# Patient Record
Sex: Female | Born: 1960
Health system: Southern US, Community
[De-identification: ages and names within clinical notes are randomized; demographics above are authoritative.]

## PROBLEM LIST (undated history)

## (undated) DIAGNOSIS — M1990 Unspecified osteoarthritis, unspecified site: Secondary | ICD-10-CM

## (undated) DIAGNOSIS — J45909 Unspecified asthma, uncomplicated: Secondary | ICD-10-CM

## (undated) DIAGNOSIS — I10 Essential (primary) hypertension: Secondary | ICD-10-CM

## (undated) HISTORY — DX: Unspecified osteoarthritis, unspecified site: M19.90

---

## 1997-12-07 ENCOUNTER — Emergency Department (HOSPITAL_COMMUNITY): Admission: EM | Admit: 1997-12-07 | Discharge: 1997-12-07 | Payer: Self-pay | Admitting: *Deleted

## 1998-03-25 ENCOUNTER — Emergency Department (HOSPITAL_COMMUNITY): Admission: EM | Admit: 1998-03-25 | Discharge: 1998-03-25 | Payer: Self-pay | Admitting: Emergency Medicine

## 1998-07-15 ENCOUNTER — Other Ambulatory Visit: Admission: RE | Admit: 1998-07-15 | Discharge: 1998-07-15 | Payer: Self-pay | Admitting: Obstetrics

## 1999-11-15 ENCOUNTER — Other Ambulatory Visit: Admission: RE | Admit: 1999-11-15 | Discharge: 1999-11-15 | Payer: Self-pay | Admitting: Obstetrics

## 2000-07-24 ENCOUNTER — Other Ambulatory Visit: Admission: RE | Admit: 2000-07-24 | Discharge: 2000-07-24 | Payer: Self-pay | Admitting: Obstetrics

## 2002-05-31 ENCOUNTER — Encounter: Admission: RE | Admit: 2002-05-31 | Discharge: 2002-05-31 | Payer: Self-pay | Admitting: Internal Medicine

## 2003-05-14 ENCOUNTER — Emergency Department (HOSPITAL_COMMUNITY): Admission: AD | Admit: 2003-05-14 | Discharge: 2003-05-14 | Payer: Self-pay | Admitting: Family Medicine

## 2004-06-27 HISTORY — PX: ABDOMINAL HYSTERECTOMY: SHX81

## 2005-12-14 ENCOUNTER — Inpatient Hospital Stay (HOSPITAL_COMMUNITY): Admission: RE | Admit: 2005-12-14 | Discharge: 2005-12-16 | Payer: Self-pay | Admitting: Obstetrics

## 2005-12-14 ENCOUNTER — Encounter (INDEPENDENT_AMBULATORY_CARE_PROVIDER_SITE_OTHER): Payer: Self-pay | Admitting: *Deleted

## 2007-07-10 ENCOUNTER — Emergency Department (HOSPITAL_COMMUNITY): Admission: EM | Admit: 2007-07-10 | Discharge: 2007-07-10 | Payer: Self-pay | Admitting: Emergency Medicine

## 2008-12-18 ENCOUNTER — Emergency Department (HOSPITAL_COMMUNITY): Admission: EM | Admit: 2008-12-18 | Discharge: 2008-12-18 | Payer: Self-pay | Admitting: Emergency Medicine

## 2010-02-17 ENCOUNTER — Emergency Department (HOSPITAL_COMMUNITY): Admission: EM | Admit: 2010-02-17 | Discharge: 2010-02-18 | Payer: Self-pay | Admitting: Emergency Medicine

## 2010-07-05 ENCOUNTER — Emergency Department (HOSPITAL_COMMUNITY)
Admission: EM | Admit: 2010-07-05 | Discharge: 2010-07-05 | Payer: Self-pay | Source: Home / Self Care | Admitting: Emergency Medicine

## 2010-11-12 NOTE — Discharge Summary (Signed)
Beth Knapp, Beth Knapp                ACCOUNT NO.:  [id number]   MEDICAL RECORD NO.:  [id number]          PATIENT TYPE:  INP   LOCATION:  9312                          FACILITY:  [facility]   PHYSICIAN:  Kathreen Cosier, M.D.DATE OF BIRTH:  1960/09/29   DATE OF ADMISSION:  12/14/2005  DATE OF DISCHARGE:  12/16/2005                                 DISCHARGE SUMMARY   HOSPITAL COURSE:  The patient is a 50 year old gravida 5, para 4-0-1-4, with  a history of large myomas, who was admitted for definitive therapy because  of lower abdominal pain.   On admission, her hemoglobin was 11.9, white count 8.3, sodium 140,  potassium 4.1, chloride 107, CO2 29, BUN 7, total bilirubin 0.7.   On December 14, 2005, she underwent a TAH and bilateral salpingectomy and did  well.  Postoperatively, her hemoglobin was 10.2.  She had an uneventful  postoperative course and was discharged on the second postoperative day,  ambulatory, on a regular diet, on Tylox for pain, to see me in 4 weeks.   DISCHARGE DIAGNOSIS:  Status post total abdominal hysterectomy/bilateral  __________ for myoma uteri.           ______________________________  Kathreen Cosier, M.D.     BAM/MEDQ  D:  12/16/2005  T:  12/16/2005  Job:  [id number]

## 2010-11-12 NOTE — Op Note (Signed)
Beth Knapp, Beth Knapp                ACCOUNT NO.:  [id number]   MEDICAL RECORD NO.:  [id number]          PATIENT TYPE:  INP   LOCATION:  9399                          FACILITY:  [facility]   PHYSICIAN:  Kathreen Cosier, M.D.DATE OF BIRTH:  [birth year]   DATE OF PROCEDURE:  12/14/2005  DATE OF DISCHARGE:                                 OPERATIVE REPORT   PREOPERATIVE DIAGNOSIS:  Myoma uteri, 16- to 18-weeks' size.   POSTOPERATIVE DIAGNOSIS:  Myoma uteri, 16- to 18-weeks' size.   PROCEDURE:  1.  Total abdominal hysterectomy.  2.  Bilateral salpingectomy.   SURGEON:  Kathreen Cosier, M.D.   FIRST ASSISTANT:  Charles A. Clearance Coots, M.D.   ANESTHESIA:  General.   PROCEDURE:  The patient was placed on the operating table in a supine  position, general anesthesia administered by Dr. Jean Rosenthal, abdomen prepped  and draped, bladder emptied with a Foley catheter.  A transverse suprapubic  incision was made and carried down to the rectus fascia, fascia cleaned and  incised the length of the incision.  The recti muscles were retracted  laterally, peritoneum incised longitudinally.  The uterus was involved  multiple myomas, 16- to 18-weeks' size.  Ovaries were normal.  The right  round ligament was grasped with a Kelly clamp, cut and suture-ligated with  #1 chromic; the procedure was done in a similar fashion on the other side.  Using the Metzenbaum scissors, the bladder flap was developed and the  bladder was dissected off the uterus and cervix.  The right utero-ovarian  ligament was grasped with a Kelly clamp, cut and suture-ligated with a #1  chromic, procedure done in a similar fashion on the other side.  Uterine  vessels were skeletonized bilaterally, double-clamped with Heaney clamps on  the right, cut and suture-ligated x2 with #1 chromic; the procedure was done  in a similar fashion on the other side.  The uterus was separated from the  cervix at this point with the scalpel.  The cervix  was grasped with straight  Kocher clamps and the cardinal and uterosacral ligaments on the right were  clamped, cut and suture-ligated with #1, procedure done in a similar fashion  on the other side.  The cervix was removed at the cervicovaginal junction  with Mayo scissors, then the vaginal vault was run.  Modified Richardson  sutures were placed in the angles of the vagina, then the vaginal vault run  with an interlocking suture of #1 chromic.  Hemostasis was satisfactory.  The opposite side was repertionealized with 2-0 chromic.  The left tube was  grasped with a Kelly clamp, cut and suture-ligated with #1 chromic,  procedure done in a similar fashion on the right tube.  Lap and sponge  counts were  correct.  The abdomen was closed in layers, the peritoneum with a continuous  suture of 0 chromic, the fascia with a continuous suture of 0 Dexon and skin  closed with a subcuticular stitch of 4-0 Monocryl.  Blood loss -- 300 mL.  The patient tolerated her procedure well and taken to recovery room in good  condition.           ______________________________  Kathreen Cosier, M.D.     BAM/MEDQ  D:  12/14/2005  T:  12/14/2005  Job:  [id number]

## 2010-12-15 ENCOUNTER — Emergency Department (HOSPITAL_COMMUNITY)
Admission: EM | Admit: 2010-12-15 | Discharge: 2010-12-15 | Disposition: A | Discharge status: Home / Self Care | Payer: 59 | Attending: Emergency Medicine | Admitting: Emergency Medicine

## 2010-12-15 DIAGNOSIS — M545 Low back pain, unspecified: Secondary | ICD-10-CM | POA: Insufficient documentation

## 2010-12-15 DIAGNOSIS — Y9269 Other specified industrial and construction area as the place of occurrence of the external cause: Secondary | ICD-10-CM | POA: Insufficient documentation

## 2010-12-15 DIAGNOSIS — Y990 Civilian activity done for income or pay: Secondary | ICD-10-CM | POA: Insufficient documentation

## 2010-12-15 DIAGNOSIS — S335XXA Sprain of ligaments of lumbar spine, initial encounter: Secondary | ICD-10-CM | POA: Insufficient documentation

## 2010-12-15 DIAGNOSIS — X500XXA Overexertion from strenuous movement or load, initial encounter: Secondary | ICD-10-CM | POA: Insufficient documentation

## 2010-12-15 LAB — URINALYSIS, ROUTINE W REFLEX MICROSCOPIC
Glucose, UA: NEGATIVE mg/dL
Hgb urine dipstick: NEGATIVE
Protein, ur: NEGATIVE mg/dL

## 2013-04-15 ENCOUNTER — Emergency Department (HOSPITAL_BASED_OUTPATIENT_CLINIC_OR_DEPARTMENT_OTHER): Payer: 59

## 2013-04-15 ENCOUNTER — Emergency Department (HOSPITAL_BASED_OUTPATIENT_CLINIC_OR_DEPARTMENT_OTHER)
Admission: EM | Admit: 2013-04-15 | Discharge: 2013-04-15 | Disposition: A | Discharge status: Home / Self Care | Payer: 59 | Attending: Emergency Medicine | Admitting: Emergency Medicine

## 2013-04-15 ENCOUNTER — Encounter (HOSPITAL_BASED_OUTPATIENT_CLINIC_OR_DEPARTMENT_OTHER): Payer: Self-pay | Admitting: Emergency Medicine

## 2013-04-15 DIAGNOSIS — S8990XA Unspecified injury of unspecified lower leg, initial encounter: Secondary | ICD-10-CM | POA: Insufficient documentation

## 2013-04-15 DIAGNOSIS — M79605 Pain in left leg: Secondary | ICD-10-CM

## 2013-04-15 DIAGNOSIS — Y9301 Activity, walking, marching and hiking: Secondary | ICD-10-CM | POA: Insufficient documentation

## 2013-04-15 DIAGNOSIS — X500XXA Overexertion from strenuous movement or load, initial encounter: Secondary | ICD-10-CM | POA: Insufficient documentation

## 2013-04-15 DIAGNOSIS — Y929 Unspecified place or not applicable: Secondary | ICD-10-CM | POA: Insufficient documentation

## 2013-04-15 MED ORDER — OXYCODONE-ACETAMINOPHEN 5-325 MG PO TABS: 1.0000 | ORAL_TABLET | Freq: Four times a day (QID) | ORAL | Status: DC | PRN

## 2013-04-15 MED ORDER — OXYCODONE-ACETAMINOPHEN 5-325 MG PO TABS
1.0000 | ORAL_TABLET | Freq: Once | ORAL | Status: AC
  Administered 2013-04-15: 1 via ORAL
  Filled 2013-04-15: qty 1

## 2013-04-15 NOTE — ED Provider Notes (Signed)
CSN: [id number]     Arrival date & time 04/15/13  1525 History  This chart was scribed for Junius Argyle, MD by Blanchard Kelch, ED Scribe. The patient was seen in room MH01/MH01. Patient's care was started at 3:42 PM.    Chief Complaint  Patient presents with  . Leg Pain    Patient is a 52 y.o. female presenting with leg pain. The history is provided by the patient. No language interpreter was used.  Leg Pain Location:  Knee Time since incident:  1 week Injury: no   Knee location:  L knee Pain details:    Quality:  Sharp   Radiates to:  L leg   Duration:  1 week   Timing:  Constant Chronicity:  New Prior injury to area:  No Relieved by:  Nothing Exacerbated by: walking. Associated symptoms: no fever     HPI Comments: Beth Knapp is a 52 y.o. female who presents to the Emergency Department complaining of constant left back of knee pain that began about a week ago. The pain radiates down to her left foot. She describes the pain as sharp and stabbing. The pain is worsened by walking, but she is ambulatory. She has been taking OTC medication for the pain without relief. She states that two days ago she heard a "pop" in the back of her leg while she was walking down the stairs. She denies any prior similar pain, strain or injury to the area. She denies fever, vomiting or diarrhea. She denies any past pertinent medical history. She does take any prescription medication daily. She has a past surgical history of a hysterectomy. She denies a history of cancer. She denies any recent immobilization. She denies a history of blood clots in her legs. She denies smoking or drug use. She consumes alcohol occasionally.    History reviewed. No pertinent past medical history. Past Surgical History  Procedure Laterality Date  . Abdominal hysterectomy     No family history on file. History  Substance Use Topics  . Smoking status: Never Smoker   . Smokeless tobacco: Not on file  . Alcohol  Use: Yes   OB History   Grav Para Term Preterm Abortions TAB SAB Ect Mult Living                 Review of Systems  Constitutional: Negative for fever and chills.  HENT: Negative for congestion.   Eyes: Negative for pain.  Respiratory: Negative for cough.   Cardiovascular: Negative for chest pain.  Gastrointestinal: Negative for vomiting and diarrhea.  Genitourinary: Negative for dysuria.  Musculoskeletal: Negative for gait problem.  Skin: Negative for rash.  Neurological: Negative for speech difficulty.  Hematological: Negative for adenopathy.  Psychiatric/Behavioral: Negative for confusion. The patient is not nervous/anxious.     Allergies  Review of patient's allergies indicates no known allergies.  Home Medications  No current outpatient prescriptions on file.  Triage Vitals: BP 165/72  Pulse 91  Temp(Src) 98.3 F (36.8 C) (Oral)  Resp 18  Ht 5\' 4"  (1.626 m)  Wt 199 lb (90.266 kg)  BMI 34.14 kg/m2  SpO2 97%  Physical Exam  Nursing note and vitals reviewed. Constitutional: She is oriented to person, place, and time. She appears well-developed and well-nourished. No distress.  HENT:  Head: Normocephalic and atraumatic.  Mouth/Throat: Oropharynx is clear and moist and mucous membranes are normal.  Eyes: Conjunctivae and EOM are normal. Pupils are equal, round, and reactive to light.  Neck:  Normal range of motion. Neck supple. No tracheal deviation present.  Cardiovascular: Normal rate, regular rhythm and normal heart sounds.   Pulses:      Popliteal pulses are 2+ on the right side, and 2+ on the left side.       Dorsalis pedis pulses are 2+ on the right side, and 2+ on the left side.       Posterior tibial pulses are 2+ on the right side, and 2+ on the left side.  2+ DP/PT pulses bilaterally. 2+ Popliteal pulses bilaterally.   Pulmonary/Chest: Effort normal and breath sounds normal. No respiratory distress.  Abdominal: Soft. She exhibits no distension. There is  no tenderness.  Musculoskeletal: Normal range of motion.  Mild tenderness to palpation of lower posterior left thigh and popliteal area. Mild darkening of the skin and mild swelling in left popliteal area. No gross asymmetry of lower extremities upon inspection.  Neurological: She is alert and oriented to person, place, and time. No sensory deficit.  Skin: Skin is warm and dry.  Psychiatric: She has a normal mood and affect. Her behavior is normal.    ED Course  Procedures (including critical care time)  DIAGNOSTIC STUDIES: Oxygen Saturation is 97% on room air, normal by my interpretation.    COORDINATION OF CARE: 3:50 PM - Will order left leg ultrasound and Percocet for the pain. Patient verbalizes understanding and agrees with treatment plan.     Labs Review Labs Reviewed - No data to display Imaging Review US Venous Img Lower Unilateral Left  04/15/2013   CLINICAL DATA:  Posterior knee pain radiates to the foot.  EXAM: VENOUS DOPPLER ULTRASOUND OF LEFT LOWER EXTREMITY  TECHNIQUE: Gray-scale sonography with graded compression, as well as color Doppler and duplex ultrasound, were performed to evaluate the deep venous system from the level of the common femoral vein through the popliteal and proximal calf veins. Spectral Doppler was utilized to evaluate flow at rest and with distal augmentation maneuvers.  COMPARISON:  None.  FINDINGS: Thrombus within deep veins:  Next visit crest that none visualized.  Compressibility of deep veins:  Normal.  Duplex waveform respiratory phasicity:  Normal.  Duplex waveform response to augmentation:  Normal.  Other findings: Posterior tibial and peroneal veins are patent or visualized below the knee.  IMPRESSION: No evidence for DVT in the left lower extremity.   Electronically Signed   By: Kennith Center M.D.   On: 04/15/2013 16:37    EKG Interpretation   None       MDM   1. Left leg pain    4:33 PM 52 y.o. female who presents with left popliteal  and left lower thigh pain for approximately one week. She denies any injuries. She is afebrile and vital signs are unremarkable here. She has mild swelling of the left posterior popliteal area and darkening of this area which could be consistent with increased perfusion of collateral superficial veins. She also has tenderness to palpation of the left popliteal area and left posterior thigh. Will give Percocet for pain and get an ultrasound to rule out DVT.  5:17 PM: I interpreted/reviewed the imaging which was non-contributory. I suspect a msk cause of her pain. Will rec rest/ice/elevation. Will provide a small amount of percocet for pain control.  I have discussed the diagnosis/risks/treatment options with the patient and family and believe the pt to be eligible for discharge home to follow-up with pcp or establish with a pcp as needed. We also discussed returning to  the ED immediately if new or worsening sx occur. We discussed the sx which are most concerning (e.g., worsening or continued pain, fever) that necessitate immediate return. Any new prescriptions provided to the patient are listed below.  New Prescriptions   OXYCODONE-ACETAMINOPHEN (PERCOCET) 5-325 MG PER TABLET    Take 1 tablet by mouth every 6 (six) hours as needed for pain.      I personally performed the services described in this documentation, which was scribed in my presence. The recorded information has been reviewed and is accurate.    Junius Argyle, MD 04/15/13 1723

## 2013-04-15 NOTE — ED Notes (Signed)
Pt c/o left calf pain from her knee down to her foot x1 week. Pt denies injury to leg and denies long car/plane trips.

## 2013-07-27 ENCOUNTER — Emergency Department (HOSPITAL_COMMUNITY)
Admission: EM | Admit: 2013-07-27 | Discharge: 2013-07-27 | Disposition: A | Discharge status: Home / Self Care | Payer: Managed Care, Other (non HMO) | Attending: Emergency Medicine | Admitting: Emergency Medicine

## 2013-07-27 ENCOUNTER — Encounter (HOSPITAL_COMMUNITY): Payer: Self-pay | Admitting: Emergency Medicine

## 2013-07-27 ENCOUNTER — Emergency Department (HOSPITAL_COMMUNITY): Payer: Managed Care, Other (non HMO)

## 2013-07-27 DIAGNOSIS — M25569 Pain in unspecified knee: Secondary | ICD-10-CM | POA: Insufficient documentation

## 2013-07-27 DIAGNOSIS — M1712 Unilateral primary osteoarthritis, left knee: Secondary | ICD-10-CM

## 2013-07-27 DIAGNOSIS — J45909 Unspecified asthma, uncomplicated: Secondary | ICD-10-CM | POA: Insufficient documentation

## 2013-07-27 DIAGNOSIS — M25469 Effusion, unspecified knee: Secondary | ICD-10-CM | POA: Insufficient documentation

## 2013-07-27 HISTORY — DX: Unspecified asthma, uncomplicated: J45.909

## 2013-07-27 MED ORDER — DICLOFENAC SODIUM ER 100 MG PO TB24: 100.0000 mg | ORAL_TABLET | Freq: Every day | ORAL | Status: DC

## 2013-07-27 MED ORDER — HYDROCODONE-ACETAMINOPHEN 5-325 MG PO TABS: 1.0000 | ORAL_TABLET | ORAL | Status: DC | PRN

## 2013-07-27 MED ORDER — HYDROCODONE-ACETAMINOPHEN 5-325 MG PO TABS
1.0000 | ORAL_TABLET | Freq: Once | ORAL | Status: DC
  Filled 2013-07-27: qty 1

## 2013-07-27 NOTE — ED Notes (Signed)
Pt c/o left leg pain with intermittent swelling x 1 month. Pt denies injury. Pt ambulatory in triage.

## 2013-07-27 NOTE — ED Provider Notes (Signed)
CSN: [id number]     Arrival date & time 07/27/13  1809 History   None    This chart was scribed for non-physician practitioner, Cherrie Distance PA-C, working with Lyanne Co, MD by Arlan Organ, ED Scribe. This patient was seen in room TR07C/TR07C and the patient's care was started at 6:27 PM.   Chief Complaint  Patient presents with  . Leg Pain    pain and swelling   The history is provided by the patient. No language interpreter was used.    HPI Comments: Beth Knapp is a 53 y.o. female who presents to the Emergency Department complaining of ongoing, intermittent, moderate left sided leg pain that radiates down to her left foot that initially started 1 month ago. She reports mild associated swelling to the area. She states she notes the pain originating in her left knee. Denies any recent injury or trauma. Pt states ambulating worsens her discomfort, and she denies any alleviating factors at this time. She reports having similar pain and being treated here in the ED about 2 months ago to the same area. At time of visit pt had a venous lower doppler performed with normal findings to her lower leg to rule out DVTs. Pt has a PMHx of asthma, and reports no other concerns at this time.  Past Medical History  Diagnosis Date  . Asthma    Past Surgical History  Procedure Laterality Date  . Abdominal hysterectomy     No family history on file. History  Substance Use Topics  . Smoking status: Never Smoker   . Smokeless tobacco: Not on file  . Alcohol Use: Yes   OB History   Grav Para Term Preterm Abortions TAB SAB Ect Mult Living                 Review of Systems  Cardiovascular: Positive for leg swelling.  Musculoskeletal: Positive for myalgias (Left sided leg pain).  All other systems reviewed and are negative.    Allergies  Review of patient's allergies indicates no known allergies.  Home Medications   Current Outpatient Rx  Name  Route  Sig  Dispense  Refill  .  oxyCODONE-acetaminophen (PERCOCET) 5-325 MG per tablet   Oral   Take 1 tablet by mouth every 6 (six) hours as needed for pain.   15 tablet   0    Triage Vitals: BP 157/82  Pulse 90  Temp(Src) 97.6 F (36.4 C) (Oral)  Resp 16  Ht 5\' 4"  (1.626 m)  Wt 196 lb (88.905 kg)  BMI 33.63 kg/m2  SpO2 97%  Physical Exam  Nursing note and vitals reviewed. Constitutional: She is oriented to person, place, and time. She appears well-developed and well-nourished.  HENT:  Head: Normocephalic and atraumatic.  Eyes: EOM are normal.  Neck: Normal range of motion.  Pulmonary/Chest: Effort normal.  Musculoskeletal: Normal range of motion. She exhibits edema and tenderness.       Right knee: She exhibits normal range of motion, no swelling, normal alignment, no LCL laxity, no bony tenderness and no MCL laxity. No tenderness found. No medial joint line and no lateral joint line tenderness noted.       Left knee: She exhibits swelling and bony tenderness. She exhibits normal range of motion, no effusion, no erythema, normal alignment, no LCL laxity and no MCL laxity. Tenderness found. Lateral joint line tenderness noted. No medial joint line tenderness noted.  Neurological: She is alert and oriented to person, place, and  time.  Skin: Skin is warm and dry.  Psychiatric: She has a normal mood and affect. Her behavior is normal.    ED Course  Procedures (including critical care time)  DIAGNOSTIC STUDIES: Oxygen Saturation is 97% on RA, Normal by my interpretation.    COORDINATION OF CARE: 6:29 PM- Will order X-Ray. Will give pain medication. Discussed treatment plan with pt at bedside and pt agreed to plan.     Labs Review Labs Reviewed - No data to display Imaging Review No results found.  EKG Interpretation   None      Results for orders placed during the hospital encounter of 12/15/10  URINALYSIS, ROUTINE W REFLEX MICROSCOPIC      Result Value Range   Color, Urine YELLOW  YELLOW    APPearance CLEAR  CLEAR   Specific Gravity, Urine 1.018  1.005 - 1.030   pH 5.5  5.0 - 8.0   Glucose, UA NEGATIVE  NEGATIVE mg/dL   Hgb urine dipstick NEGATIVE  NEGATIVE   Bilirubin Urine NEGATIVE  NEGATIVE   Ketones, ur NEGATIVE  NEGATIVE mg/dL   Protein, ur NEGATIVE  NEGATIVE mg/dL   Urobilinogen, UA 0.2  0.0 - 1.0 mg/dL   Nitrite NEGATIVE  NEGATIVE   Leukocytes, UA NEGATIVE  NEGATIVE   Dg Knee Complete 4 Views Left  07/27/2013   CLINICAL DATA:  Leg pain  EXAM: LEFT KNEE - COMPLETE 4+ VIEW  COMPARISON:  None.  FINDINGS: No fracture or dislocation. Minimal tricompartmental arthritis. Cannot exclude possibility of a tiny joint effusion.  IMPRESSION: Mild degenerative changes.   Electronically Signed   By: Esperanza Heiraymond  Rubner M.D.   On: 07/27/2013 19:21     MDM  Left knee osteoarthritis  Patient here with left knee pain - has been evaluated recently for same with negative US at that time.  Now with complaints of pain to anterior portion of the knee.  No evidence of lexity in the ligaments, no instability of the joint and no effusion noted on exam.  No erythema  To suggest septic arthritis.  I personally performed the services described in this documentation, which was scribed in my presence. The recorded information has been reviewed and is accurate.    Izola PriceFrances C. Marisue HumbleSanford, New JerseyPA-C 07/27/13 1939

## 2013-07-27 NOTE — ED Provider Notes (Signed)
Medical screening examination/treatment/procedure(s) were performed by non-physician practitioner and as supervising physician I was immediately available for consultation/collaboration.  EKG Interpretation   None         Kemberly Taves M Beya Tipps, MD 07/27/13 2305 

## 2013-07-27 NOTE — ED Notes (Signed)
Patient transported to X-ray 

## 2013-07-27 NOTE — Discharge Instructions (Signed)
Arthritis, Nonspecific °Arthritis is inflammation of a joint. This usually means pain, redness, warmth or swelling are present. One or more joints may be involved. There are a number of types of arthritis. Your caregiver may not be able to tell what type of arthritis you have right away. °CAUSES  °The most common cause of arthritis is the wear and tear on the joint (osteoarthritis). This causes damage to the cartilage, which can break down over time. The knees, hips, back and neck are most often affected by this type of arthritis. °Other types of arthritis and common causes of joint pain include: °· Sprains and other injuries near the joint. Sometimes minor sprains and injuries cause pain and swelling that develop hours later. °· Rheumatoid arthritis. This affects hands, feet and knees. It usually affects both sides of your body at the same time. It is often associated with chronic ailments, fever, weight loss and general weakness. °· Crystal arthritis. Gout and pseudo gout can cause occasional acute severe pain, redness and swelling in the foot, ankle, or knee. °· Infectious arthritis. Bacteria can get into a joint through a break in overlying skin. This can cause infection of the joint. Bacteria and viruses can also spread through the blood and affect your joints. °· Drug, infectious and allergy reactions. Sometimes joints can become mildly painful and slightly swollen with these types of illnesses. °SYMPTOMS  °· Pain is the main symptom. °· Your joint or joints can also be red, swollen and warm or hot to the touch. °· You may have a fever with certain types of arthritis, or even feel overall ill. °· The joint with arthritis will hurt with movement. Stiffness is present with some types of arthritis. °DIAGNOSIS  °Your caregiver will suspect arthritis based on your description of your symptoms and on your exam. Testing may be needed to find the type of arthritis: °· Blood and sometimes urine tests. °· X-ray tests  and sometimes CT or MRI scans. °· Removal of fluid from the joint (arthrocentesis) is done to check for bacteria, crystals or other causes. Your caregiver (or a specialist) will numb the area over the joint with a local anesthetic, and use a needle to remove joint fluid for examination. This procedure is only minimally uncomfortable. °· Even with these tests, your caregiver may not be able to tell what kind of arthritis you have. Consultation with a specialist (rheumatologist) may be helpful. °TREATMENT  °Your caregiver will discuss with you treatment specific to your type of arthritis. If the specific type cannot be determined, then the following general recommendations may apply. °Treatment of severe joint pain includes: °· Rest. °· Elevation. °· Anti-inflammatory medication (for example, ibuprofen) may be prescribed. Avoiding activities that cause increased pain. °· Only take over-the-counter or prescription medicines for pain and discomfort as recommended by your caregiver. °· Cold packs over an inflamed joint may be used for 10 to 15 minutes every hour. Hot packs sometimes feel better, but do not use overnight. Do not use hot packs if you are diabetic without your caregiver's permission. °· A cortisone shot into arthritic joints may help reduce pain and swelling. °· Any acute arthritis that gets worse over the next 1 to 2 days needs to be looked at to be sure there is no joint infection. °Long-term arthritis treatment involves modifying activities and lifestyle to reduce joint stress jarring. This can include weight loss. Also, exercise is needed to nourish the joint cartilage and remove waste. This helps keep the muscles   around the joint strong. °HOME CARE INSTRUCTIONS  °· Do not take aspirin to relieve pain if gout is suspected. This elevates uric acid levels. °· Only take over-the-counter or prescription medicines for pain, discomfort or fever as directed by your caregiver. °· Rest the joint as much as  possible. °· If your joint is swollen, keep it elevated. °· Use crutches if the painful joint is in your leg. °· Drinking plenty of fluids may help for certain types of arthritis. °· Follow your caregiver's dietary instructions. °· Try low-impact exercise such as: °· Swimming. °· Water aerobics. °· Biking. °· Walking. °· Morning stiffness is often relieved by a warm shower. °· Put your joints through regular range-of-motion. °SEEK MEDICAL CARE IF:  °· You do not feel better in 24 hours or are getting worse. °· You have side effects to medications, or are not getting better with treatment. °SEEK IMMEDIATE MEDICAL CARE IF:  °· You have a fever. °· You develop severe joint pain, swelling or redness. °· Many joints are involved and become painful and swollen. °· There is severe back pain and/or leg weakness. °· You have loss of bowel or bladder control. °Document Released: 07/21/2004 Document Revised: 09/05/2011 Document Reviewed: 08/06/2008 °ExitCare® Patient Information ©2014 [employer]. ° °Wear and Tear Disorders of the Knee (Arthritis, Osteoarthritis) °Everyone will experience wear and tear injuries (arthritis, osteoarthritis) of the knee. These are the changes we all get as we age. They come from the joint stress of daily living. The amount of cartilage damage in your knee and your symptoms determine if you need surgery. Mild problems require approximately two months recovery time. More severe problems take several months to recover. With mild problems, your surgeon may find worn and rough cartilage surfaces. With severe changes, your surgeon may find cartilage that has completely worn away and exposed the bone. Loose bodies of bone and cartilage, bone spurs (excess bone growth), and injuries to the menisci (cushions between the large bones of your leg) are also common. All of these problems can cause pain. °For a mild wear and tear problem, rough cartilage may simply need to be shaved and smoothed. For more  severe problems with areas of exposed bone, your surgeon may use an instrument for roughing up the bone surfaces to stimulate new cartilage growth. Loose bodies are usually removed. Torn menisci may be trimmed or repaired. °ABOUT THE ARTHROSCOPIC PROCEDURE °Arthroscopy is a surgical technique. It allows your orthopedic surgeon to diagnose and treat your knee injury with accuracy. The surgeon looks into your knee through a small scope. The scope is like a small (pencil-sized) telescope. Arthroscopy is less invasive than open knee surgery. You can expect a more rapid recovery. After the procedure, you will be moved to a recovery area until most of the effects of the medication have worn off. Your caregiver will discuss the test results with you. °RECOVERY °The severity of the arthritis and the type of procedure performed will determine recovery time. Other important factors include age, physical condition, medical conditions, and the type of rehabilitation program. Strengthening your muscles after arthroscopy helps guarantee a better recovery. Follow your caregiver's instructions. Use crutches, rest, elevate, ice, and do knee exercises as instructed. Your caregivers will help you and instruct you with exercises and other physical therapy required to regain your mobility, muscle strength, and functioning following surgery. Only take over-the-counter or prescription medicines for pain, discomfort, or fever as directed by your caregiver.  °SEEK MEDICAL CARE IF:  °· There   is increased bleeding (more than a small spot) from the wound. °· You notice redness, swelling, or increasing pain in the wound. °· Pus is coming from wound. °· You develop an unexplained oral temperature above 102° F (38.9° C) , or as your caregiver suggests. °· You notice a foul smell coming from the wound or dressing. °· You have severe pain with motion of the knee. °SEEK IMMEDIATE MEDICAL CARE IF:  °· You develop a rash. °· You have difficulty  breathing. °· You have any allergic problems. °MAKE SURE YOU:  °· Understand these instructions. °· Will watch your condition. °· Will get help right away if you are not doing well or get worse. °Document Released: 06/10/2000 Document Revised: 09/05/2011 Document Reviewed: 11/07/2007 °ExitCare® Patient Information ©2014 [employer]. ° °

## 2014-04-22 ENCOUNTER — Encounter: Payer: Self-pay | Admitting: Gastroenterology

## 2014-04-25 ENCOUNTER — Ambulatory Visit (AMBULATORY_SURGERY_CENTER): Payer: Self-pay | Admitting: *Deleted

## 2014-04-25 VITALS — Ht 64.0 in | Wt 204.2 lb

## 2014-04-25 DIAGNOSIS — Z1211 Encounter for screening for malignant neoplasm of colon: Secondary | ICD-10-CM

## 2014-04-25 MED ORDER — MOVIPREP 100 G PO SOLR: ORAL | Status: DC

## 2014-04-25 NOTE — Progress Notes (Signed)
No allergies to eggs or soy. No problems with anesthesia.  Pt not given Emmi instructions for colonoscopy; no computer access  No oxygen use  No diet drug use  

## 2014-04-30 NOTE — Addendum Note (Signed)
Addended by: Gillermina HuMCCRAW, ELIZABETH W on: 04/30/2014 09:45 AM   Modules accepted: Level of Service

## 2014-05-01 ENCOUNTER — Encounter: Payer: Self-pay | Admitting: Gastroenterology

## 2014-05-06 ENCOUNTER — Ambulatory Visit (AMBULATORY_SURGERY_CENTER): Payer: Managed Care, Other (non HMO) | Admitting: Gastroenterology

## 2014-05-06 ENCOUNTER — Encounter: Payer: Self-pay | Admitting: Gastroenterology

## 2014-05-06 VITALS — BP 162/92 | HR 68 | Temp 97.8°F | Resp 16 | Ht 64.0 in | Wt 204.0 lb

## 2014-05-06 DIAGNOSIS — Z1211 Encounter for screening for malignant neoplasm of colon: Secondary | ICD-10-CM

## 2014-05-06 MED ORDER — SODIUM CHLORIDE 0.9 % IV SOLN: 500.0000 mL | INTRAVENOUS | Status: DC

## 2014-05-06 NOTE — Progress Notes (Signed)
Procedure ends, to recovery, report given and VSS. 

## 2014-05-06 NOTE — Patient Instructions (Signed)
YOU HAD AN ENDOSCOPIC PROCEDURE TODAY AT THE [facility]: Refer to the procedure report that was given to you for any specific questions about what was found during the examination.  If the procedure report does not answer your questions, please call your gastroenterologist to clarify.  If you requested that your care partner not be given the details of your procedure findings, then the procedure report has been included in a sealed envelope for you to review at your convenience later.  YOU SHOULD EXPECT: Some feelings of bloating in the abdomen. Passage of more gas than usual.  Walking can help get rid of the air that was put into your GI tract during the procedure and reduce the bloating. If you had a lower endoscopy (such as a colonoscopy or flexible sigmoidoscopy) you may notice spotting of blood in your stool or on the toilet paper. If you underwent a bowel prep for your procedure, then you may not have a normal bowel movement for a few days.  DIET: Your first meal following the procedure should be a light meal and then it is ok to progress to your normal diet.  A half-sandwich or bowl of soup is an example of a good first meal.  Heavy or fried foods are harder to digest and may make you feel nauseous or bloated.  Likewise meals heavy in dairy and vegetables can cause extra gas to form and this can also increase the bloating.  Drink plenty of fluids but you should avoid alcoholic beverages for 24 hours.  ACTIVITY: Your care partner should take you home directly after the procedure.  You should plan to take it easy, moving slowly for the rest of the day.  You can resume normal activity the day after the procedure however you should NOT DRIVE or use heavy machinery for 24 hours (because of the sedation medicines used during the test).    SYMPTOMS TO REPORT IMMEDIATELY: A gastroenterologist can be reached at any hour.  During normal business hours, 8:30 AM to 5:00 PM Monday through Friday,  call [phone].  After hours and on weekends, please call the GI answering service at [phone] who will take a message and have the physician on call contact you.   Following lower endoscopy (colonoscopy or flexible sigmoidoscopy):  Excessive amounts of blood in the stool  Significant tenderness or worsening of abdominal pains  Swelling of the abdomen that is new, acute  Fever of 100F or higher   FOLLOW UP: If any biopsies were taken you will be contacted by phone or by letter within the next 1-3 weeks.  Call your gastroenterologist if you have not heard about the biopsies in 3 weeks.  Our staff will call the home number listed on your records the next business day following your procedure to check on you and address any questions or concerns that you may have at that time regarding the information given to you following your procedure. This is a courtesy call and so if there is no answer at the home number and we have not heard from you through the emergency physician on call, we will assume that you have returned to your regular daily activities without incident.  SIGNATURES/CONFIDENTIALITY: You and/or your care partner have signed paperwork which will be entered into your electronic medical record.  These signatures attest to the fact that that the information above on your After Visit Summary has been reviewed and is understood.  Full responsibility of the confidentiality of   this discharge information lies with you and/or your care-partner.   Resume medications.Information given on diverticulosis,hemorrhoids and high fiber diet with discharge instructions. 

## 2014-05-06 NOTE — Op Note (Signed)
[facility] [address] [place], [id number]   COLONOSCOPY PROCEDURE REPORT  PATIENT: Beth EpleyWoods, Beth Knapp  MR#: [id number] BIRTHDATE: Aug 21, 1960 , 52  yrs. old GENDER: female ENDOSCOPIST: Meryl DareMalcolm T Shandrika Ambers, MD, [facility] REFERRED BY: Virl Sonammy Boyd, MD PROCEDURE DATE:  05/06/2014 PROCEDURE:   Colonoscopy, screening First Screening Colonoscopy - Avg.  risk and is 50 yrs.  old or older Yes.  Prior Negative Screening - Now for repeat screening. N/A  History of Adenoma - Now for follow-up colonoscopy & has been > or = to 3 yrs.  N/A  Polyps Removed Today? No.  Polyps Removed Today? No.  Recommend repeat exam, <10 yrs? Polyps Removed Today? No.  Recommend repeat exam, <10 yrs? No. ASA CLASS:   Class II INDICATIONS:average risk for colorectal cancer. MEDICATIONS: Monitored anesthesia care and Propofol 220 mg IV DESCRIPTION OF PROCEDURE:   After the risks benefits and alternatives of the procedure were thoroughly explained, informed consent was obtained.  The digital rectal exam revealed no abnormalities of the rectum.   The LB [id number]  endoscope was introduced through the anus and advanced to the cecum, which was identified by both the appendix and ileocecal valve. No adverse events experienced.   The quality of the prep was excellent, using MoviPrep  The instrument was then slowly withdrawn as the colon was fully examined.    COLON FINDINGS: There was mild diverticulosis noted in the ascending colon and at the cecum.   The examination was otherwise normal. Retroflexed views revealed internal Grade I hemorrhoids. The time to cecum=1 minutes 32 seconds.  Withdrawal time=8 minutes 53 seconds.  The scope was withdrawn and the procedure completed.  COMPLICATIONS: There were no immediate complications.  ENDOSCOPIC IMPRESSION: 1.   Mild diverticulosis in the ascending colon and at the cecum 2.   Grade l internal hemorrhoids  RECOMMENDATIONS: 1. High fiber diet with  liberal fluid intake. 2.  Colonoscopy in 10 years for routine risk screening. No need for routine, screening stool hemoccult testing for at least 5 years.  eSigned:  Meryl DareMalcolm T Soriya Worster, MD, [facility] 05/06/2014 11:35 AM

## 2014-05-07 ENCOUNTER — Telehealth: Payer: Self-pay

## 2014-05-07 NOTE — Telephone Encounter (Signed)
  Follow up Call-  Call back number 05/06/2014  Post procedure Call Back phone  # [phone]  Permission to leave phone message Yes     Patient questions:  Do you have a fever, pain , or abdominal swelling? No. Pain Score  0 *  Have you tolerated food without any problems? Yes.    Have you been able to return to your normal activities? Yes.    Do you have any questions about your discharge instructions: Diet   No. Medications  No. Follow up visit  No.  Do you have questions or concerns about your Care? No.  Actions: * If pain score is 4 or above: No action needed, pain <4.

## 2014-05-08 DIAGNOSIS — M79672 Pain in left foot: Secondary | ICD-10-CM

## 2014-05-08 DIAGNOSIS — M79671 Pain in right foot: Secondary | ICD-10-CM | POA: Insufficient documentation

## 2014-07-14 DIAGNOSIS — R609 Edema, unspecified: Secondary | ICD-10-CM | POA: Insufficient documentation

## 2015-02-12 ENCOUNTER — Emergency Department (HOSPITAL_COMMUNITY)
Admission: EM | Admit: 2015-02-12 | Discharge: 2015-02-12 | Disposition: A | Discharge status: Home / Self Care | Payer: BLUE CROSS/BLUE SHIELD | Attending: Emergency Medicine | Admitting: Emergency Medicine

## 2015-02-12 ENCOUNTER — Emergency Department (HOSPITAL_COMMUNITY): Payer: BLUE CROSS/BLUE SHIELD

## 2015-02-12 ENCOUNTER — Encounter (HOSPITAL_COMMUNITY): Payer: Self-pay | Admitting: Cardiology

## 2015-02-12 DIAGNOSIS — R079 Chest pain, unspecified: Secondary | ICD-10-CM | POA: Diagnosis not present

## 2015-02-12 DIAGNOSIS — Z8739 Personal history of other diseases of the musculoskeletal system and connective tissue: Secondary | ICD-10-CM | POA: Diagnosis not present

## 2015-02-12 DIAGNOSIS — Z79899 Other long term (current) drug therapy: Secondary | ICD-10-CM | POA: Diagnosis not present

## 2015-02-12 DIAGNOSIS — J45901 Unspecified asthma with (acute) exacerbation: Secondary | ICD-10-CM | POA: Diagnosis not present

## 2015-02-12 LAB — BASIC METABOLIC PANEL
Anion gap: 8 (ref 5–15)
BUN: 12 mg/dL (ref 6–20)
CO2: 29 mmol/L (ref 22–32)
Calcium: 9.3 mg/dL (ref 8.9–10.3)
Chloride: 106 mmol/L (ref 101–111)
Creatinine, Ser: 0.55 mg/dL (ref 0.44–1.00)
GFR calc Af Amer: >60 mL/min (ref >60)
GFR calc non Af Amer: >60 mL/min (ref >60)
Glucose, Bld: 94 mg/dL (ref 65–99)
Potassium: 4.2 mmol/L (ref 3.5–5.1)
Sodium: 143 mmol/L (ref 135–145)

## 2015-02-12 LAB — CBC
HCT: 37.9 % (ref 36.0–46.0)
Hemoglobin: 12.1 g/dL (ref 12.0–15.0)
MCH: 27.3 pg (ref 26.0–34.0)
MCHC: 31.9 g/dL (ref 30.0–36.0)
MCV: 85.4 fL (ref 78.0–100.0)
Platelets: 289 10*3/uL (ref 150–400)
RBC: 4.44 MIL/uL (ref 3.87–5.11)
RDW: 13.2 % (ref 11.5–15.5)
WBC: 10.3 10*3/uL (ref 4.0–10.5)

## 2015-02-12 LAB — I-STAT TROPONIN, ED: TROPONIN I, POC: 0 ng/mL (ref 0.00–0.08)

## 2015-02-12 MED ORDER — DIAZEPAM 5 MG PO TABS
5.0000 mg | ORAL_TABLET | Freq: Once | ORAL | Status: AC
  Administered 2015-02-12: 5 mg via ORAL
  Filled 2015-02-12: qty 1

## 2015-02-12 MED ORDER — KETOROLAC TROMETHAMINE 15 MG/ML IJ SOLN
15.0000 mg | Freq: Once | INTRAMUSCULAR | Status: AC
  Administered 2015-02-12: 15 mg via INTRAMUSCULAR
  Filled 2015-02-12: qty 1

## 2015-02-12 MED ORDER — METHOCARBAMOL 500 MG PO TABS: 500.0000 mg | ORAL_TABLET | Freq: Two times a day (BID) | ORAL | Status: DC | PRN

## 2015-02-12 NOTE — ED Notes (Signed)
Pt reports right sided chest pain for the past 3 days. Reports some SOB with the pain. Skin warm and dry.

## 2015-02-12 NOTE — Discharge Instructions (Signed)

## 2015-02-19 NOTE — ED Provider Notes (Signed)
CSN: [id number]     Arrival date & time 02/12/15  1236 History   First MD Initiated Contact with Patient 02/12/15 1357     Chief Complaint  Patient presents with  . Chest Pain     (Consider location/radiation/quality/duration/timing/severity/associated sxs/prior Treatment) HPI  54 year old female with chest pain. Right sided. Feels like a deep burning pain. Relatively constant. Onset was about 3 days ago. Denies any trauma. No appreciable exacerbating relieving factors. Feels mildly short of breath and the pain is worse. No cough. No fevers or chills. No unusual leg pain or swelling. Does not notice any significant change with exertion.  Past Medical History  Diagnosis Date  . Asthma   . Arthritis    Past Surgical History  Procedure Laterality Date  . Abdominal hysterectomy  2006   Family History  Problem Relation Age of Onset  . Colon cancer Neg Hx    Social History  Substance Use Topics  . Smoking status: Never Smoker   . Smokeless tobacco: Never Used  . Alcohol Use: 1.8 oz/week    3 Cans of beer per week   OB History    No data available     Review of Systems  All systems reviewed and negative, other than as noted in HPI.   Allergies  Review of patient's allergies indicates no known allergies.  Home Medications   Prior to Admission medications   Medication Sig Start Date End Date Taking? Authorizing Provider  furosemide (LASIX) 40 MG tablet Take 40 mg by mouth.   Yes Historical Provider, MD  losartan (COZAAR) 25 MG tablet Take 25 mg by mouth daily.   Yes Historical Provider, MD  Multiple Vitamins-Minerals (MULTIVITAMIN PO) Take 1 tablet by mouth daily.    Yes Historical Provider, MD  potassium chloride (K-DUR) 10 MEQ tablet Take 10 mEq by mouth daily.   Yes Historical Provider, MD  methocarbamol (ROBAXIN) 500 MG tablet Take 1 tablet (500 mg total) by mouth 2 (two) times daily as needed for muscle spasms. 02/12/15   Raeford Razor, MD   BP 130/82 mmHg  Pulse  71  Temp(Src) 98.6 F (37 C) (Oral)  Resp 16  Wt 204 lb (92.534 kg)  SpO2 99% Physical Exam  Constitutional: She appears well-developed and well-nourished. No distress.  HENT:  Head: Normocephalic and atraumatic.  Eyes: Conjunctivae are normal. Right eye exhibits no discharge. Left eye exhibits no discharge.  Neck: Neck supple.  Cardiovascular: Normal rate, regular rhythm and normal heart sounds.  Exam reveals no gallop and no friction rub.   No murmur heard. Pulmonary/Chest: Effort normal and breath sounds normal. No respiratory distress.  Abdominal: Soft. She exhibits no distension. There is no tenderness.  Musculoskeletal: She exhibits no edema or tenderness.  Lower extremities symmetric as compared to each other. No calf tenderness. Negative Homan's. No palpable cords.   Neurological: She is alert.  Skin: Skin is warm and dry.  Psychiatric: She has a normal mood and affect. Her behavior is normal. Thought content normal.  Nursing note and vitals reviewed.   ED Course  Procedures (including critical care time) Labs Review Labs Reviewed  BASIC METABOLIC PANEL  CBC  I-STAT TROPOININ, ED    Imaging Review No results found. I have personally reviewed and evaluated these images and lab results as part of my medical decision-making.   EKG Interpretation   Date/Time:  Thursday February 12 2015 13:14:37 EDT Ventricular Rate:  84 PR Interval:  126 QRS Duration: 80 QT Interval:  360  QTC Calculation: 425 R Axis:   -6 Text Interpretation:  Normal sinus rhythm Non-specific ST-t changes  Abnormal ECG No old tracing to compare Confirmed by Naraya Stoneberg  MD, Maymuna Detzel  [phone]) on 02/12/2015 2:01:33 PM      MDM   Final diagnoses:  Chest pain, unspecified chest pain type    54 year old female with chest pain. Right sided. Relatively constant. Atypical for ACS. EKG with nonspecific changes. No old for comparison. Doubt pulmonary embolism, dissection or other emergent process.It has  been determined that no acute conditions requiring further emergency intervention are present at this time. The patient has been advised of the diagnosis and plan. I reviewed any labs and imaging including any potential incidental findings. We have discussed signs and symptoms that warrant return to the ED and they are listed in the discharge instructions.      Raeford Razor, MD 02/19/15 1536

## 2015-08-12 ENCOUNTER — Emergency Department (HOSPITAL_COMMUNITY)
Admission: EM | Admit: 2015-08-12 | Discharge: 2015-08-12 | Disposition: A | Discharge status: Home / Self Care | Payer: Managed Care, Other (non HMO) | Attending: Emergency Medicine | Admitting: Emergency Medicine

## 2015-08-12 ENCOUNTER — Encounter (HOSPITAL_COMMUNITY): Payer: Self-pay | Admitting: Emergency Medicine

## 2015-08-12 ENCOUNTER — Emergency Department (HOSPITAL_COMMUNITY): Payer: Managed Care, Other (non HMO)

## 2015-08-12 DIAGNOSIS — M1612 Unilateral primary osteoarthritis, left hip: Secondary | ICD-10-CM

## 2015-08-12 DIAGNOSIS — R1030 Lower abdominal pain, unspecified: Secondary | ICD-10-CM | POA: Insufficient documentation

## 2015-08-12 DIAGNOSIS — Z79899 Other long term (current) drug therapy: Secondary | ICD-10-CM | POA: Insufficient documentation

## 2015-08-12 DIAGNOSIS — I10 Essential (primary) hypertension: Secondary | ICD-10-CM | POA: Insufficient documentation

## 2015-08-12 DIAGNOSIS — J45909 Unspecified asthma, uncomplicated: Secondary | ICD-10-CM | POA: Insufficient documentation

## 2015-08-12 HISTORY — DX: Essential (primary) hypertension: I10

## 2015-08-12 MED ORDER — METHOCARBAMOL 500 MG PO TABS: 500.0000 mg | ORAL_TABLET | Freq: Two times a day (BID) | ORAL | Status: AC | PRN

## 2015-08-12 MED ORDER — IBUPROFEN 800 MG PO TABS: 800.0000 mg | ORAL_TABLET | Freq: Three times a day (TID) | ORAL | Status: AC | PRN

## 2015-08-12 NOTE — Discharge Instructions (Signed)

## 2015-08-12 NOTE — ED Notes (Signed)
Patient states pain is only present when ambulating or bearing weight.

## 2015-08-12 NOTE — ED Notes (Signed)
Patient here with complaint of left hip pain. States onset yesterday. Denies injury. Ambulatory in and out of triage but with exacerbation of pain.

## 2015-08-12 NOTE — ED Provider Notes (Signed)
CSN: [id number]     Arrival date & time 08/12/15  1935 History  By signing my name below, I, Beth Knapp, attest that this documentation has been prepared under the direction and in the presence of Fayrene Helper, PA-C Electronically Signed: Soijett Knapp, ED Scribe. 08/12/2015. 9:16 PM.   Chief Complaint  Patient presents with  . Hip Pain      The history is provided by the patient. No language interpreter was used.    Beth Knapp is a 55 y.o. female with a medical hx of arthritis, HTN who presents to the Emergency Department complaining of constant, aching, left hip pain radiating to her left groin onset yesterday. She notes that she was ambulating when she first noticed the left hip pain. She notes her left hip pain is worsened with ambulating or bearing weight. Pt notes that her left hip pain is alleviated with sitting. Pt denies having this pain in the past. denies any alleviating factors. 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 She notes that she has tried advil yesterday for the relief of her symptoms. She denies color change, wound, rash, swelling, difficulty urinating, constipation, fever, chills, back pain, and any other symptoms.    Past Medical History  Diagnosis Date  . Asthma   . Arthritis   . Hypertension    Past Surgical History  Procedure Laterality Date  . Abdominal hysterectomy  2006   Family History  Problem Relation Age of Onset  . Colon cancer Neg Hx    Social History  Substance Use Topics  . Smoking status: Never Smoker   . Smokeless tobacco: Never Used  . Alcohol Use: 1.8 oz/week    3 Cans of beer per week   OB History    No data available     Review of Systems  Gastrointestinal: Negative for constipation.  Genitourinary: Negative for difficulty urinating.  Musculoskeletal: Positive for arthralgias. Negative for joint swelling and gait problem.  Skin: Negative for color change, rash and wound.      Allergies  Review of patient's  allergies indicates no known allergies.  Home Medications   Prior to Admission medications   Medication Sig Start Date End Date Taking? Authorizing Provider  furosemide (LASIX) 40 MG tablet Take 40 mg by mouth.    Historical Provider, MD  losartan (COZAAR) 25 MG tablet Take 25 mg by mouth daily.    Historical Provider, MD  methocarbamol (ROBAXIN) 500 MG tablet Take 1 tablet (500 mg total) by mouth 2 (two) times daily as needed for muscle spasms. 02/12/15   Raeford Razor, MD  Multiple Vitamins-Minerals (MULTIVITAMIN PO) Take 1 tablet by mouth daily.     Historical Provider, MD  potassium chloride (K-DUR) 10 MEQ tablet Take 10 mEq by mouth daily.    Historical Provider, MD   BP 155/99 mmHg  Pulse 101  Temp(Src) 99.2 F (37.3 C) (Oral)  Resp 16  Ht  (1.651 m)  Wt 202 lb (91.627 kg)  BMI 33.61 kg/m2  SpO2 95% Physical Exam  Constitutional: She is oriented to person, place, and time. She appears well-developed and well-nourished. No distress.  HENT:  Head: Normocephalic and atraumatic.  Eyes: EOM are normal.  Neck: Neck supple.  Cardiovascular: Normal rate.   Dorsalis pedis pulses intact.   Pulmonary/Chest: Effort normal. No respiratory distress.  Musculoskeletal: Normal range of motion.  Mild tenderness noted to left inguinal region with nl hip flexion, extension, abduction and adduction. No left knee or  ankle tenderness. No significant midline spinal tenderness, crepitus, or step-offs.   Neurological: She is alert and oriented to person, place, and time.  Pt able to ambulate without difficulty   Skin: Skin is warm and dry.  Psychiatric: She has a normal mood and affect. Her behavior is normal.  Nursing note and vitals reviewed.   ED Course  Procedures (including critical care time) DIAGNOSTIC STUDIES: Oxygen Saturation is 95% on RA, adequate by my interpretation.    COORDINATION OF CARE: 9:15 PM Discussed treatment plan with pt at bedside which includes gentle massages,  anti-inflammatory, referral and follow up with orthopedist PRN and pt agreed to plan.    Imaging Review Dg Hip Unilat With Pelvis 2-3 Views Left  08/12/2015  CLINICAL DATA:  Left hip pain for 2 days.  No known injury. EXAM: DG HIP (WITH OR WITHOUT PELVIS) 2-3V LEFT COMPARISON:  None. FINDINGS: Mild degenerative changes in the left hip with sclerosis and mild osteophyte formation on the superior acetabulum. Mild symmetrical degenerative changes in the right hip. No evidence of acute fracture or dislocation. No focal bone lesion or bone destruction. Pelvis appears intact. SI joints and symphysis pubis are not displaced. Visualized sacrum appears intact. Calcified phleboliths in the pelvis. IMPRESSION: Mild degenerative changes in the left hip. No acute bony abnormalities. Electronically Signed   By: Burman Nieves M.D.   On: 08/12/2015 21:11   I have personally reviewed and evaluated these images as part of my medical decision-making.   MDM   Final diagnoses:  Arthritis of left hip    Patient X-Ray negative for obvious fracture or dislocation.  Pt advised to follow up with orthopedics. Patient given Rx for robaxin and ibuprofen and conservative therapy recommended and discussed. Patient will be discharged home & is agreeable with above plan. Returns precautions discussed. Pt appears safe for discharge.   I personally performed the services described in this documentation, which was scribed in my presence. The recorded information has been reviewed and is accurate.      Fayrene Helper, PA-C 08/13/15 0130  Glynn Octave, MD 08/13/15 [phone]

## 2015-09-17 ENCOUNTER — Encounter (HOSPITAL_COMMUNITY): Payer: Self-pay

## 2015-09-17 ENCOUNTER — Ambulatory Visit (HOSPITAL_COMMUNITY)
Admission: RE | Admit: 2015-09-17 | Discharge: 2015-09-17 | Disposition: A | Discharge status: Home / Self Care | Payer: Managed Care, Other (non HMO) | Source: Ambulatory Visit | Attending: Obstetrics and Gynecology | Admitting: Obstetrics and Gynecology

## 2015-09-17 VITALS — BP 130/84 | Temp 98.1°F | Ht 64.0 in | Wt 204.0 lb

## 2015-09-17 DIAGNOSIS — Z1239 Encounter for other screening for malignant neoplasm of breast: Secondary | ICD-10-CM

## 2015-09-17 NOTE — Patient Instructions (Signed)
Educational materials on self breast awareness given. Explained to Beth Knapp that she did not need a Pap smear today due to last Pap smear was in 2015 per patient and has a history of a hysterectomy for benign reasons. Let patient know that she no longer needs Pap smears due to her history of a hysterectomy for benign reasons. Ref[facility] for a diagnostic mammogram per recommendation. Appointment scheduled for Tuesday, September 22, 2015 at 1330. Patient aware of appointment and will be there. Beth Knapp verbalized understanding.  [place]annock, Kathaleen Maserhristine Poll, RN 10:41 AM

## 2015-09-17 NOTE — Progress Notes (Signed)
Complaints of occasional left breast pain. Patient rates pain at a 4 out of 10. Patient had a right breast diagnostic mammogram 01/20/2015 that 6 month diagnostic mammogram was recommended. [facility].  Pap Smear: Pap smear not completed today. Last Pap smear was in 2015 and normal per patient. Per patient has no history of abnormal Pap smear. Patient has a history of a complete hysterectomy in 2005 for DUB. Patient no longer needs Pap smears due to her history of a hysterectomy for benign reasons. No Pap smear results in EPIC.  Physical exam: Breasts Breasts symmetrical. No skin abnormalities bilateral breasts. No nipple retraction bilateral breasts. No nipple discharge bilateral breasts. No lymphadenopathy. No lumps palpated bilateral breasts. No complaints of pain or tenderness on exam. [facility] for a diagnostic mammogram per recommendation. Appointment scheduled for Tuesday, September 22, 2015 at 1330.  Pelvic/Bimanual No Pap smear completed today since patient has a history of a hysterectomy for benign reasons. Pap smear not indicated per [facility] guidelines.   Smoking History: Patient has never smoked.  Patient Navigation: Patient education provided. Access to services [facility].   Colorectal Cancer Screening: Patient had a colonoscopy completed in 2016. No complaints today.

## 2015-09-23 ENCOUNTER — Encounter (HOSPITAL_COMMUNITY): Payer: Self-pay | Admitting: *Deleted

## 2015-10-06 ENCOUNTER — Telehealth: Payer: Self-pay

## 2015-10-06 ENCOUNTER — Encounter (HOSPITAL_COMMUNITY): Payer: Self-pay | Admitting: *Deleted

## 2015-10-06 NOTE — Telephone Encounter (Signed)
Called to informed about [facility]. Patient not answer, left message to return call if interested in participating in program.

## 2016-09-22 ENCOUNTER — Ambulatory Visit (HOSPITAL_COMMUNITY): Payer: Managed Care, Other (non HMO)

## 2016-09-23 ENCOUNTER — Telehealth (HOSPITAL_COMMUNITY): Payer: Self-pay | Admitting: *Deleted

## 2016-09-23 NOTE — Telephone Encounter (Signed)
Telephoned patient at home number and left message to return call to [facility] 

## 2016-10-03 ENCOUNTER — Other Ambulatory Visit: Payer: Self-pay | Admitting: Obstetrics and Gynecology

## 2016-10-03 DIAGNOSIS — Z1231 Encounter for screening mammogram for malignant neoplasm of breast: Secondary | ICD-10-CM

## 2016-10-20 ENCOUNTER — Ambulatory Visit
Admission: RE | Admit: 2016-10-20 | Discharge: 2016-10-20 | Disposition: A | Discharge status: Home / Self Care | Payer: No Typology Code available for payment source | Source: Ambulatory Visit | Attending: Obstetrics and Gynecology | Admitting: Obstetrics and Gynecology

## 2016-10-20 ENCOUNTER — Ambulatory Visit (HOSPITAL_COMMUNITY)
Admission: RE | Admit: 2016-10-20 | Discharge: 2016-10-20 | Disposition: A | Discharge status: Home / Self Care | Payer: Self-pay | Source: Ambulatory Visit | Attending: Obstetrics and Gynecology | Admitting: Obstetrics and Gynecology

## 2016-10-20 ENCOUNTER — Encounter (HOSPITAL_COMMUNITY): Payer: Self-pay

## 2016-10-20 VITALS — BP 142/80 | Temp 98.1°F | Ht 64.0 in | Wt 194.4 lb

## 2016-10-20 DIAGNOSIS — Z1239 Encounter for other screening for malignant neoplasm of breast: Secondary | ICD-10-CM

## 2016-10-20 DIAGNOSIS — Z1231 Encounter for screening mammogram for malignant neoplasm of breast: Secondary | ICD-10-CM

## 2016-10-20 NOTE — Patient Instructions (Addendum)
Explained breast self awareness with Beth Knapp. Let patient know that she does not need a Pap smear today due to her history of a hysterectomy for benign reasons. Let patient know that she no longer needs Pap smears due to her history of a hysterectomy for benign reasons. Referred patient to the [facility] for a screening mammogram. Appointment scheduled for Thursday, October 20, 2016 at 1440. Beth Knapp verbalized understanding.  Katalaya Beel, Kathaleen Maser, RN 1:45 PM

## 2016-10-20 NOTE — Progress Notes (Signed)
No complaints today.   Pap Smear: Pap smear not completed today. Last Pap smear was in 2015 and normal per patient. Per patient has no history of abnormal Pap smear. Patient has a history of a complete hysterectomy in 2005 for DUB. Patient no longer needs Pap smears due to her history of a hysterectomy for benign reasons. No Pap smear results in EPIC.  Physical exam: Breasts Breasts symmetrical. No skin abnormalities bilateral breasts. No nipple retraction bilateral breasts. No nipple discharge bilateral breasts. No lymphadenopathy. No lumps palpated bilateral breasts. No complaints of pain or tenderness on exam. Referred patient to the [facility] for a screening mammogram. Appointment scheduled for Thursday, October 20, 2016 at 1440.        Pelvic/Bimanual No Pap smear completed today since patient has a history of a hysterectomy for benign reasons. Pap smear not indicated per [facility] guidelines.   Smoking History: Patient has never smoked.  Patient Navigation: Patient education provided. Access to services provided for patient through [facility] program.   Colorectal Cancer Screening: Per patient had a colonoscopy completed in October 2017. No complaints today.

## 2016-10-21 ENCOUNTER — Encounter (HOSPITAL_COMMUNITY): Payer: Self-pay | Admitting: *Deleted

## 2017-09-19 DIAGNOSIS — Z1389 Encounter for screening for other disorder: Secondary | ICD-10-CM | POA: Diagnosis not present

## 2017-09-19 DIAGNOSIS — Z6833 Body mass index (BMI) 33.0-33.9, adult: Secondary | ICD-10-CM | POA: Diagnosis not present

## 2017-09-19 DIAGNOSIS — I10 Essential (primary) hypertension: Secondary | ICD-10-CM | POA: Diagnosis not present

## 2017-10-04 DIAGNOSIS — H40013 Open angle with borderline findings, low risk, bilateral: Secondary | ICD-10-CM | POA: Diagnosis not present

## 2017-10-04 DIAGNOSIS — H2513 Age-related nuclear cataract, bilateral: Secondary | ICD-10-CM | POA: Diagnosis not present

## 2017-10-05 ENCOUNTER — Other Ambulatory Visit: Payer: Self-pay | Admitting: Internal Medicine

## 2017-10-05 DIAGNOSIS — Z139 Encounter for screening, unspecified: Secondary | ICD-10-CM

## 2017-10-25 ENCOUNTER — Ambulatory Visit
Admission: RE | Admit: 2017-10-25 | Discharge: 2017-10-25 | Disposition: A | Discharge status: Home / Self Care | Payer: BLUE CROSS/BLUE SHIELD | Source: Ambulatory Visit | Attending: Internal Medicine | Admitting: Internal Medicine

## 2017-10-25 DIAGNOSIS — Z139 Encounter for screening, unspecified: Secondary | ICD-10-CM

## 2017-10-25 DIAGNOSIS — Z1231 Encounter for screening mammogram for malignant neoplasm of breast: Secondary | ICD-10-CM | POA: Diagnosis not present

## 2017-12-26 DIAGNOSIS — Z0000 Encounter for general adult medical examination without abnormal findings: Secondary | ICD-10-CM | POA: Diagnosis not present

## 2017-12-26 DIAGNOSIS — R82998 Other abnormal findings in urine: Secondary | ICD-10-CM | POA: Diagnosis not present

## 2017-12-26 DIAGNOSIS — I10 Essential (primary) hypertension: Secondary | ICD-10-CM | POA: Diagnosis not present

## 2018-01-02 DIAGNOSIS — Z0000 Encounter for general adult medical examination without abnormal findings: Secondary | ICD-10-CM | POA: Diagnosis not present

## 2018-01-02 DIAGNOSIS — Z1389 Encounter for screening for other disorder: Secondary | ICD-10-CM | POA: Diagnosis not present

## 2018-01-02 DIAGNOSIS — N3281 Overactive bladder: Secondary | ICD-10-CM | POA: Diagnosis not present

## 2018-01-02 DIAGNOSIS — R600 Localized edema: Secondary | ICD-10-CM | POA: Diagnosis not present

## 2018-01-02 DIAGNOSIS — I10 Essential (primary) hypertension: Secondary | ICD-10-CM | POA: Diagnosis not present

## 2018-01-05 DIAGNOSIS — Z1212 Encounter for screening for malignant neoplasm of rectum: Secondary | ICD-10-CM | POA: Diagnosis not present

## 2018-01-17 ENCOUNTER — Ambulatory Visit: Payer: BLUE CROSS/BLUE SHIELD | Admitting: Podiatry

## 2018-01-17 ENCOUNTER — Encounter: Payer: Self-pay | Admitting: Podiatry

## 2018-01-17 DIAGNOSIS — M79672 Pain in left foot: Secondary | ICD-10-CM

## 2018-01-17 DIAGNOSIS — Q828 Other specified congenital malformations of skin: Secondary | ICD-10-CM | POA: Diagnosis not present

## 2018-01-17 DIAGNOSIS — I10 Essential (primary) hypertension: Secondary | ICD-10-CM | POA: Insufficient documentation

## 2018-01-17 DIAGNOSIS — N9089 Other specified noninflammatory disorders of vulva and perineum: Secondary | ICD-10-CM | POA: Insufficient documentation

## 2018-01-17 DIAGNOSIS — D649 Anemia, unspecified: Secondary | ICD-10-CM | POA: Insufficient documentation

## 2018-01-17 NOTE — Progress Notes (Signed)
   Subjective:    Patient ID: Beth Knapp, female    DOB: [birth year], 57 y.o.   MRN: [id number]  HPI 57 year old female presents the office today for concerns of a painful spot in the ball of her left foot which is been ongoing for about a year.  She has majority pain after being on her feet for some time.  She denies any swelling or redness or any drainage coming from the area.  Denies recent injury to the area.  She has no other concerns.   Review of Systems  All other systems reviewed and are negative.  Past Medical History:  Diagnosis Date  . Arthritis   . Asthma   . Hypertension     Past Surgical History:  Procedure Laterality Date  . ABDOMINAL HYSTERECTOMY  2006     Current Outpatient Medications:  .  furosemide (LASIX) 40 MG tablet, Take 40 mg by mouth. Reported on 09/17/2015, Disp: , Rfl:  .  ibuprofen (ADVIL,MOTRIN) 800 MG tablet, Take 1 tablet (800 mg total) by mouth every 8 (eight) hours as needed for moderate pain. (Patient not taking: Reported on 01/17/2018), Disp: 21 tablet, Rfl: 0 .  losartan (COZAAR) 25 MG tablet, Take 25 mg by mouth daily., Disp: , Rfl:  .  methocarbamol (ROBAXIN) 500 MG tablet, Take 1 tablet (500 mg total) by mouth 2 (two) times daily as needed for muscle spasms. (Patient not taking: Reported on 10/20/2016), Disp: 12 tablet, Rfl: 0 .  Multiple Vitamins-Minerals (MULTIVITAMIN PO), Take 1 tablet by mouth daily. , Disp: , Rfl:  .  potassium chloride (K-DUR) 10 MEQ tablet, Take 10 mEq by mouth daily. Reported on 09/17/2015, Disp: , Rfl:  .  telmisartan (MICARDIS) 40 MG tablet, Take 40 mg by mouth daily., Disp: , Rfl: 4  No Known Allergies       Objective:   Physical Exam General: AAO x3, NAD  Dermatological: Punctate deep, annular hyperkeratotic lesion present left foot submetatarsal 2.  Upon arrival there is no underlying ulceration, drainage, foreign body or evidence of verruca.  There is no edema, erythema, drainage or pus or any clinical  signs of infection.  No open lesion identified otherwise.  Vascular: Dorsalis Pedis artery and Posterior Tibial artery pedal pulses are 2/4 bilateral with immedate capillary fill time. There is no pain with calf compression, swelling, warmth, erythema.   Neruologic: Grossly intact via light touch bilateral. Protective threshold with Semmes Wienstein monofilament intact to all pedal sites bilateral.  Musculoskeletal: Tenderness palpation the hyperkeratotic lesion left foot submetatarsal 2.  No other areas of tenderness identified at this time.  Muscular strength 5/5 in all groups tested bilateral.  Gait: Unassisted, Nonantalgic.     Assessment & Plan:  Porokeratosis left foot -Treatment options discussed including all alternatives, risks, and complications -Etiology of symptoms were discussed -The lesion was sharply debrided today without any complications of the area skin with alcohol. It appeared to be a deep punctate annular lesion.  There is cleaned with alcohol and a pad was placed followed by salicylic acid and a bandage.  Post procedure instructions were discussed.  Monitoring signs or symptoms of infection.  Discussed moisturizer to the area daily and offloading pads were dispensed as well. Vivi Barrack. Matthew R Wagoner DPM

## 2018-04-08 DIAGNOSIS — R109 Unspecified abdominal pain: Secondary | ICD-10-CM | POA: Diagnosis not present

## 2018-04-09 ENCOUNTER — Emergency Department (HOSPITAL_COMMUNITY)
Admission: EM | Admit: 2018-04-09 | Discharge: 2018-04-10 | Disposition: A | Discharge status: Home / Self Care | Payer: BLUE CROSS/BLUE SHIELD | Attending: Emergency Medicine | Admitting: Emergency Medicine

## 2018-04-09 ENCOUNTER — Encounter (HOSPITAL_COMMUNITY): Payer: Self-pay | Admitting: *Deleted

## 2018-04-09 DIAGNOSIS — N3000 Acute cystitis without hematuria: Secondary | ICD-10-CM

## 2018-04-09 DIAGNOSIS — R7989 Other specified abnormal findings of blood chemistry: Secondary | ICD-10-CM

## 2018-04-09 DIAGNOSIS — I10 Essential (primary) hypertension: Secondary | ICD-10-CM | POA: Insufficient documentation

## 2018-04-09 DIAGNOSIS — B349 Viral infection, unspecified: Secondary | ICD-10-CM

## 2018-04-09 DIAGNOSIS — Z79899 Other long term (current) drug therapy: Secondary | ICD-10-CM | POA: Diagnosis not present

## 2018-04-09 DIAGNOSIS — R51 Headache: Secondary | ICD-10-CM | POA: Diagnosis not present

## 2018-04-09 DIAGNOSIS — J45909 Unspecified asthma, uncomplicated: Secondary | ICD-10-CM | POA: Diagnosis not present

## 2018-04-09 DIAGNOSIS — R945 Abnormal results of liver function studies: Secondary | ICD-10-CM | POA: Diagnosis not present

## 2018-04-09 LAB — COMPREHENSIVE METABOLIC PANEL
ALT: 84 U/L — ABNORMAL HIGH (ref 0–44)
ANION GAP: 9 (ref 5–15)
AST: 60 U/L — ABNORMAL HIGH (ref 15–41)
Albumin: 3 g/dL — ABNORMAL LOW (ref 3.5–5.0)
Alkaline Phosphatase: 135 U/L — ABNORMAL HIGH (ref 38–126)
BUN: 11 mg/dL (ref 6–20)
CO2: 27 mmol/L (ref 22–32)
Calcium: 8.7 mg/dL — ABNORMAL LOW (ref 8.9–10.3)
Chloride: 104 mmol/L (ref 98–111)
Creatinine, Ser: 0.78 mg/dL (ref 0.44–1.00)
GFR calc Af Amer: >60 mL/min (ref >60)
Glucose, Bld: 113 mg/dL — ABNORMAL HIGH (ref 70–99)
POTASSIUM: 3.1 mmol/L — AB (ref 3.5–5.1)
Sodium: 140 mmol/L (ref 135–145)
TOTAL PROTEIN: 6.8 g/dL (ref 6.5–8.1)
Total Bilirubin: 1 mg/dL (ref 0.3–1.2)

## 2018-04-09 LAB — CBC
HCT: 32.4 % — ABNORMAL LOW (ref 36.0–46.0)
Hemoglobin: 10.4 g/dL — ABNORMAL LOW (ref 12.0–15.0)
MCH: 28 pg (ref 26.0–34.0)
MCHC: 32.1 g/dL (ref 30.0–36.0)
MCV: 87.1 fL (ref 80.0–100.0)
NRBC: 0 % (ref 0.0–0.2)
Platelets: 157 10*3/uL (ref 150–400)
RBC: 3.72 MIL/uL — ABNORMAL LOW (ref 3.87–5.11)
RDW: 12.2 % (ref 11.5–15.5)
WBC: 9.9 10*3/uL (ref 4.0–10.5)

## 2018-04-09 LAB — LIPASE, BLOOD: LIPASE: 31 U/L (ref 11–51)

## 2018-04-09 NOTE — ED Triage Notes (Signed)
Pt in c/o headache, hot flashes and diarrhea, has had intermittent abdominal pain, took some pepto at home with improvement

## 2018-04-10 LAB — URINALYSIS, ROUTINE W REFLEX MICROSCOPIC
BILIRUBIN URINE: NEGATIVE
Glucose, UA: NEGATIVE mg/dL
KETONES UR: NEGATIVE mg/dL
Nitrite: POSITIVE — AB
PH: 5 (ref 5.0–8.0)
Protein, ur: NEGATIVE mg/dL
SPECIFIC GRAVITY, URINE: 1.011 (ref 1.005–1.030)

## 2018-04-10 MED ORDER — SODIUM CHLORIDE 0.9 % IV BOLUS
1000.0000 mL | Freq: Once | INTRAVENOUS | Status: AC
  Administered 2018-04-10: 1000 mL via INTRAVENOUS

## 2018-04-10 MED ORDER — PROMETHAZINE HCL 25 MG/ML IJ SOLN
12.5000 mg | Freq: Once | INTRAMUSCULAR | Status: AC
  Administered 2018-04-10: 12.5 mg via INTRAVENOUS
  Filled 2018-04-10: qty 1

## 2018-04-10 MED ORDER — KETOROLAC TROMETHAMINE 15 MG/ML IJ SOLN
15.0000 mg | Freq: Once | INTRAMUSCULAR | Status: AC
  Administered 2018-04-10: 15 mg via INTRAVENOUS
  Filled 2018-04-10: qty 1

## 2018-04-10 MED ORDER — NAPROXEN 500 MG PO TABS: 500.0000 mg | ORAL_TABLET | Freq: Two times a day (BID) | ORAL | 0 refills | Status: AC

## 2018-04-10 MED ORDER — CEPHALEXIN 500 MG PO CAPS: 500.0000 mg | ORAL_CAPSULE | Freq: Three times a day (TID) | ORAL | 0 refills | Status: AC

## 2018-04-10 NOTE — ED Provider Notes (Addendum)
[facility] EMERGENCY DEPARTMENT Provider Note   CSN: [id number] Arrival date & time: 04/09/18  1732     History   Chief Complaint Chief Complaint  Patient presents with  . Headache  . Abdominal Pain    HPI Beth Knapp is a 57 y.o. female.  HPI  This is a 57 year old female with a history of asthma hypertension who presents with 1 week history of headache, diarrhea, chills.  Patient reports for the last week she has had generalized body aches, chills, hot flashes, diarrhea and headache.  She took Pepto-Bismol and has not noted improvement of her diarrhea but has noted black stools.  She denies any significant abdominal pain.  She has had nausea and one episode of vomiting that was nonbilious and nonbloody.  Patient reports she has had an ongoing headache during this time.  She reports that it is frontal and nonradiating.  Currently is 6 out of 10.  She took Aleve at home with minimal relief.  No known sick contacts.  She reports chills without documented fevers.  She does report some back pain during this time that has resolved.  Denies any urinary symptoms including dysuria or hematuria.  She states "I just want to feel better."  Past Medical History:  Diagnosis Date  . Arthritis   . Asthma   . Hypertension     Patient Active Problem List   Diagnosis Date Noted  . Lesion of vulva 01/17/2018  . Hypertensive disorder 01/17/2018  . Anemia 01/17/2018  . Chronic edema 07/14/2014  . Bilateral foot pain 05/08/2014  . Left leg pain 04/15/2013    Past Surgical History:  Procedure Laterality Date  . ABDOMINAL HYSTERECTOMY  2006     OB History    Gravida  5   Para  4   Term  4   Preterm      AB  1   Living  4     SAB  1   TAB      Ectopic      Multiple      Live Births               Home Medications    Prior to Admission medications   Medication Sig Start Date End Date Taking? Authorizing Provider  cephALEXin (KEFLEX) 500 MG  capsule Take 1 capsule (500 mg total) by mouth 3 (three) times daily. 04/10/18   Izael Bessinger, Mayer Masker, MD  furosemide (LASIX) 40 MG tablet Take 40 mg by mouth. Reported on 09/17/2015    [provider]  ibuprofen (ADVIL,MOTRIN) 800 MG tablet Take 1 tablet (800 mg total) by mouth every 8 (eight) hours as needed for moderate pain. Patient not taking: Reported on 01/17/2018 08/12/15   Fayrene Helper, PA-C  losartan (COZAAR) 25 MG tablet Take 25 mg by mouth daily.    [provider]  methocarbamol (ROBAXIN) 500 MG tablet Take 1 tablet (500 mg total) by mouth 2 (two) times daily as needed for muscle spasms. Patient not taking: Reported on 10/20/2016 08/12/15   Fayrene Helper, PA-C  Multiple Vitamins-Minerals (MULTIVITAMIN PO) Take 1 tablet by mouth daily.     [provider]  naproxen (NAPROSYN) 500 MG tablet Take 1 tablet (500 mg total) by mouth 2 (two) times daily. 04/10/18   Kyro Joswick, Mayer Masker, MD  potassium chloride (K-DUR) 10 MEQ tablet Take 10 mEq by mouth daily. Reported on 09/17/2015    [provider]  telmisartan (MICARDIS) 40 MG tablet  Take 40 mg by mouth daily. 12/20/17   [provider]    Family History Family History  Problem Relation Age of Onset  . Hypertension Mother   . Colon cancer Neg Hx     Social History Social History   Tobacco Use  . Smoking status: Never Smoker  . Smokeless tobacco: Never Used  Substance Use Topics  . Alcohol use: Yes    Comment: weekends  . Drug use: No     Allergies   Patient has no known allergies.   Review of Systems Review of Systems  Constitutional: Positive for chills. Negative for fever.  Respiratory: Negative for cough and shortness of breath.   Cardiovascular: Negative for chest pain.  Gastrointestinal: Positive for diarrhea, nausea and vomiting. Negative for abdominal pain and blood in stool.  Musculoskeletal: Negative for neck pain.  Neurological: Positive for headaches.  All other systems  reviewed and are negative.    Physical Exam Updated Vital Signs BP 132/71 (BP Location: Right Arm)   Pulse 77   Temp 98.7 F (37.1 C) (Oral)   Resp 18   SpO2 97%   Physical Exam  Constitutional: She is oriented to person, place, and time. She appears well-developed and well-nourished.  Non-toxic appearance. She does not appear ill.  HENT:  Head: Normocephalic and atraumatic.  Eyes: Pupils are equal, round, and reactive to light.  Neck: Normal range of motion. Neck supple.  Cardiovascular: Normal rate, regular rhythm and normal heart sounds.  Pulmonary/Chest: Effort normal. No respiratory distress. She has no wheezes.  Abdominal: Soft. Bowel sounds are normal.  Neurological: She is alert and oriented to person, place, and time.  Cranial nerves II through XII intact, 5 out of 5 strength in all 4 extremities, no dysmetria to finger-nose-finger  Skin: Skin is warm and dry.  Psychiatric: She has a normal mood and affect.  Nursing note and vitals reviewed.    ED Treatments / Results  Labs (all labs ordered are listed, but only abnormal results are displayed) Labs Reviewed  COMPREHENSIVE METABOLIC PANEL - Abnormal; Notable for the following components:      Result Value   Potassium 3.1 (*)    Glucose, Bld 113 (*)    Calcium 8.7 (*)    Albumin 3.0 (*)    AST 60 (*)    ALT 84 (*)    Alkaline Phosphatase 135 (*)    All other components within normal limits  CBC - Abnormal; Notable for the following components:   RBC 3.72 (*)    Hemoglobin 10.4 (*)    HCT 32.4 (*)    All other components within normal limits  URINALYSIS, ROUTINE W REFLEX MICROSCOPIC - Abnormal; Notable for the following components:   APPearance HAZY (*)    Hgb urine dipstick MODERATE (*)    Nitrite POSITIVE (*)    Leukocytes, UA MODERATE (*)    Bacteria, UA MANY (*)    All other components within normal limits  URINE CULTURE  LIPASE, BLOOD    EKG None  Radiology No results  found.  Procedures Procedures (including critical care time)  Medications Ordered in ED Medications  sodium chloride 0.9 % bolus 1,000 mL (0 mLs Intravenous Stopped 04/10/18 0405)  ketorolac (TORADOL) 15 MG/ML injection 15 mg (15 mg Intravenous Given 04/10/18 0145)  promethazine (PHENERGAN) injection 12.5 mg (12.5 mg Intravenous Given 04/10/18 0145)     Initial Impression / Assessment and Plan / ED Course  I have reviewed the triage vital signs  and the nursing notes.  Pertinent labs & imaging results that were available during my care of the patient were reviewed by me and considered in my medical decision making (see chart for details).     Patient presents with multiple complaints including myalgias, chills, diarrhea, headache.  She is overall nontoxic-appearing.  Vital signs reassuring.  Denies abdominal pain and abdominal exam is benign.  Her exam is otherwise reassuring.  Lab work-up is only notable for mildly elevated LFTs.  She has again a benign abdominal exam.  Given constellation of symptoms with chills, suspect viral etiology.  She does report dark stools but has been taking Pepto-Bismol.  Patient was given a migraine cocktail and fluids for her headache and myalgias.  She reports improvement of symptoms.  LFT abnormality likely related to viral etiology.  She does need to have recheck in 1 to 2 weeks by her primary physician.  I discussed this with the patient.  Of note, patient does have evidence of likely UTI.  Urine culture was sent.  No evidence of pyelonephritis.  She clinically is well-appearing.  Will treat with Keflex.  After history, exam, and medical workup I feel the patient has been appropriately medically screened and is safe for discharge home. Pertinent diagnoses were discussed with the patient. Patient was given return precautions.   Final Clinical Impressions(s) / ED Diagnoses   Final diagnoses:  Viral syndrome  Elevated liver function tests  Acute cystitis  without hematuria    ED Discharge Orders         Ordered    naproxen (NAPROSYN) 500 MG tablet  2 times daily     04/10/18 0226    cephALEXin (KEFLEX) 500 MG capsule  3 times daily     04/10/18 0408           Sanyla Summey, Mayer Masker, MD 04/10/18 1610    Shon Baton, MD 04/10/18 [phone]

## 2018-04-10 NOTE — Discharge Instructions (Addendum)
You were seen today for multiple complaints.  You likely had a virus.  Your work-up is largely reassuring.  You have evidence of a UTI.  You did have mild elevation in your liver function test.  You need to have these rechecked by her primary physician.  Take naproxen as needed for any pain.

## 2018-04-12 LAB — URINE CULTURE: Culture: >=100000 — AB

## 2018-04-13 ENCOUNTER — Telehealth: Payer: Self-pay

## 2018-04-13 NOTE — Telephone Encounter (Signed)
Post ED Visit - Positive Culture Follow-up  Culture report reviewed by antimicrobial stewardship pharmacist:  []  Enzo Bi, Pharm.D. []  Celedonio Miyamoto, Pharm.D., BCPS AQ-ID []  Garvin Fila, Pharm.D., BCPS []  Georgina Pillion, Pharm.D., BCPS []  [place], [place].D., BCPS, AAHIVP []  Estella Husk, Pharm.D., BCPS, AAHIVP []  Lysle Pearl, PharmD, BCPS []  Phillips Climes, PharmD, BCPS []  Agapito Games, PharmD, BCPS []  Verlan Friends, PharmD Babs Bertin Pharm D Positive urine culture Treated with Cephalexin, organism sensitive to the same and no further patient follow-up is required at this time.  Jerry Caras 04/13/2018, 10:31 AM

## 2018-04-20 DIAGNOSIS — Z6831 Body mass index (BMI) 31.0-31.9, adult: Secondary | ICD-10-CM | POA: Diagnosis not present

## 2018-04-20 DIAGNOSIS — R748 Abnormal levels of other serum enzymes: Secondary | ICD-10-CM | POA: Diagnosis not present

## 2018-04-20 DIAGNOSIS — I10 Essential (primary) hypertension: Secondary | ICD-10-CM | POA: Diagnosis not present

## 2018-04-20 DIAGNOSIS — N3000 Acute cystitis without hematuria: Secondary | ICD-10-CM | POA: Diagnosis not present

## 2018-09-06 DIAGNOSIS — J181 Lobar pneumonia, unspecified organism: Secondary | ICD-10-CM | POA: Diagnosis not present

## 2018-09-06 DIAGNOSIS — R509 Fever, unspecified: Secondary | ICD-10-CM | POA: Diagnosis not present

## 2018-09-06 DIAGNOSIS — D72829 Elevated white blood cell count, unspecified: Secondary | ICD-10-CM | POA: Diagnosis not present

## 2018-09-06 DIAGNOSIS — R05 Cough: Secondary | ICD-10-CM | POA: Diagnosis not present

## 2018-09-25 ENCOUNTER — Other Ambulatory Visit: Payer: Self-pay | Admitting: Internal Medicine

## 2018-10-08 DIAGNOSIS — J181 Lobar pneumonia, unspecified organism: Secondary | ICD-10-CM | POA: Diagnosis not present

## 2018-11-21 ENCOUNTER — Other Ambulatory Visit: Payer: Self-pay | Admitting: Internal Medicine

## 2018-11-26 ENCOUNTER — Other Ambulatory Visit: Payer: Self-pay | Admitting: Internal Medicine

## 2018-11-26 DIAGNOSIS — Z1231 Encounter for screening mammogram for malignant neoplasm of breast: Secondary | ICD-10-CM

## 2018-11-27 ENCOUNTER — Ambulatory Visit
Admission: RE | Admit: 2018-11-27 | Discharge: 2018-11-27 | Disposition: A | Discharge status: Home / Self Care | Payer: BLUE CROSS/BLUE SHIELD | Source: Ambulatory Visit | Attending: Internal Medicine | Admitting: Internal Medicine

## 2018-11-27 ENCOUNTER — Other Ambulatory Visit: Payer: Self-pay

## 2018-11-27 DIAGNOSIS — Z1231 Encounter for screening mammogram for malignant neoplasm of breast: Secondary | ICD-10-CM | POA: Diagnosis not present

## 2018-12-26 DIAGNOSIS — H40013 Open angle with borderline findings, low risk, bilateral: Secondary | ICD-10-CM | POA: Diagnosis not present

## 2018-12-26 DIAGNOSIS — H2513 Age-related nuclear cataract, bilateral: Secondary | ICD-10-CM | POA: Diagnosis not present

## 2019-01-01 DIAGNOSIS — I10 Essential (primary) hypertension: Secondary | ICD-10-CM | POA: Diagnosis not present

## 2019-01-01 DIAGNOSIS — R7989 Other specified abnormal findings of blood chemistry: Secondary | ICD-10-CM | POA: Diagnosis not present

## 2019-01-01 DIAGNOSIS — Z0000 Encounter for general adult medical examination without abnormal findings: Secondary | ICD-10-CM | POA: Diagnosis not present

## 2019-01-11 DIAGNOSIS — I10 Essential (primary) hypertension: Secondary | ICD-10-CM | POA: Diagnosis not present

## 2019-01-11 DIAGNOSIS — R82998 Other abnormal findings in urine: Secondary | ICD-10-CM | POA: Diagnosis not present

## 2019-01-15 DIAGNOSIS — Z0000 Encounter for general adult medical examination without abnormal findings: Secondary | ICD-10-CM | POA: Diagnosis not present

## 2019-01-15 DIAGNOSIS — N3281 Overactive bladder: Secondary | ICD-10-CM | POA: Diagnosis not present

## 2019-01-15 DIAGNOSIS — R748 Abnormal levels of other serum enzymes: Secondary | ICD-10-CM | POA: Diagnosis not present

## 2019-01-15 DIAGNOSIS — I10 Essential (primary) hypertension: Secondary | ICD-10-CM | POA: Diagnosis not present

## 2019-01-15 DIAGNOSIS — Z1331 Encounter for screening for depression: Secondary | ICD-10-CM | POA: Diagnosis not present

## 2019-01-15 DIAGNOSIS — E669 Obesity, unspecified: Secondary | ICD-10-CM | POA: Diagnosis not present

## 2019-05-21 ENCOUNTER — Other Ambulatory Visit: Payer: Self-pay

## 2019-05-21 DIAGNOSIS — Z20822 Contact with and (suspected) exposure to covid-19: Secondary | ICD-10-CM

## 2019-05-22 LAB — NOVEL CORONAVIRUS, NAA: SARS-CoV-2, NAA: NOT DETECTED

## 2019-12-04 ENCOUNTER — Other Ambulatory Visit: Payer: Self-pay | Admitting: Internal Medicine

## 2020-01-30 IMAGING — MG DIGITAL SCREENING BILATERAL MAMMOGRAM WITH TOMO AND CAD
8 series · 8 of 24 positions shown · non-contrast
Comparison: Previous exam(s).

CLINICAL DATA: Screening.

EXAM:
DIGITAL SCREENING BILATERAL MAMMOGRAM WITH TOMO AND CAD

[L CC synth-2D]
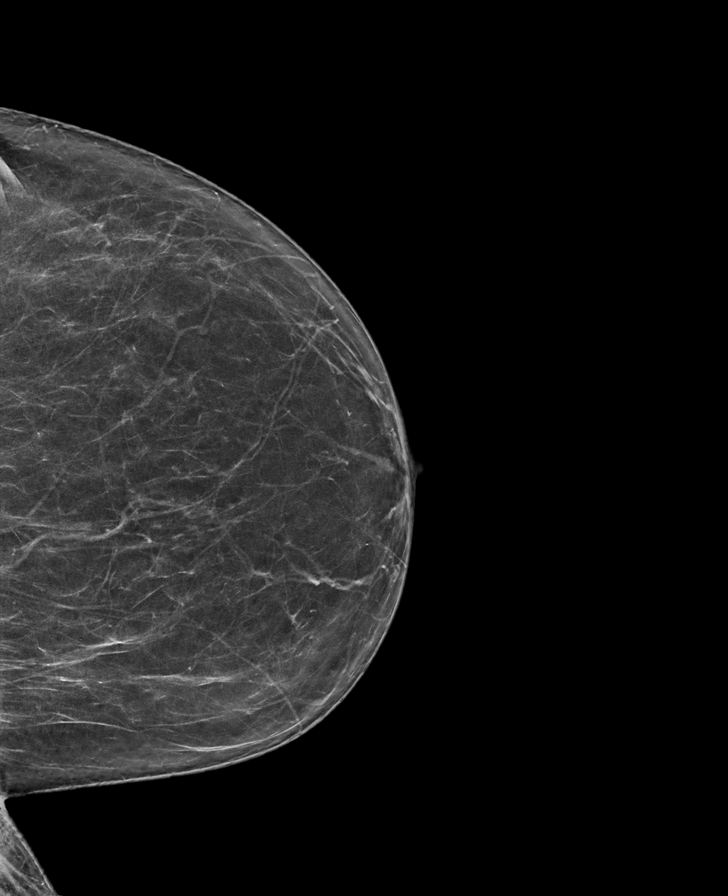

[L MLO synth-2D]
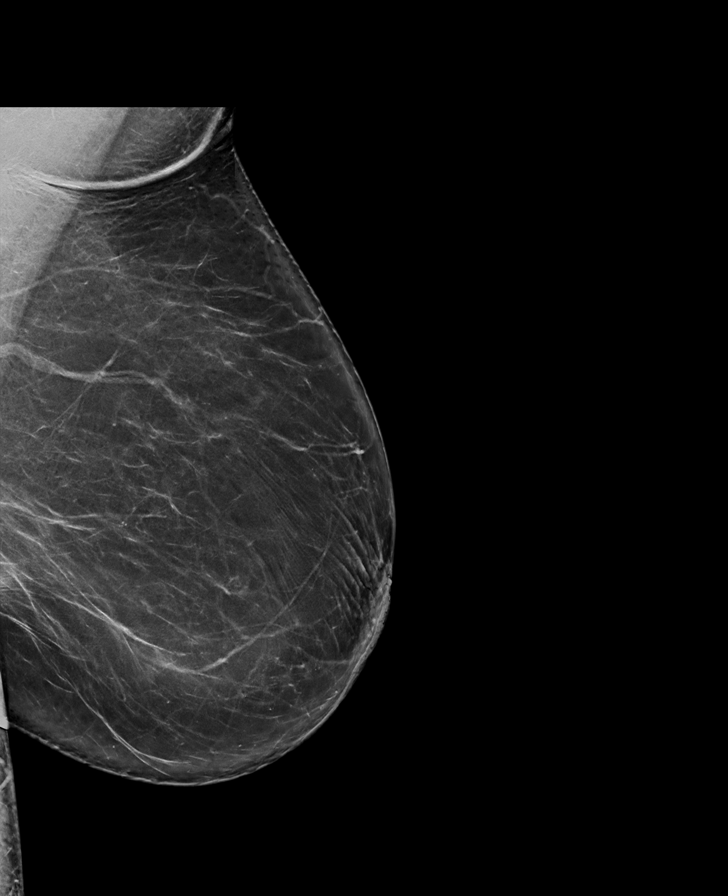

[R MLO synth-2D]
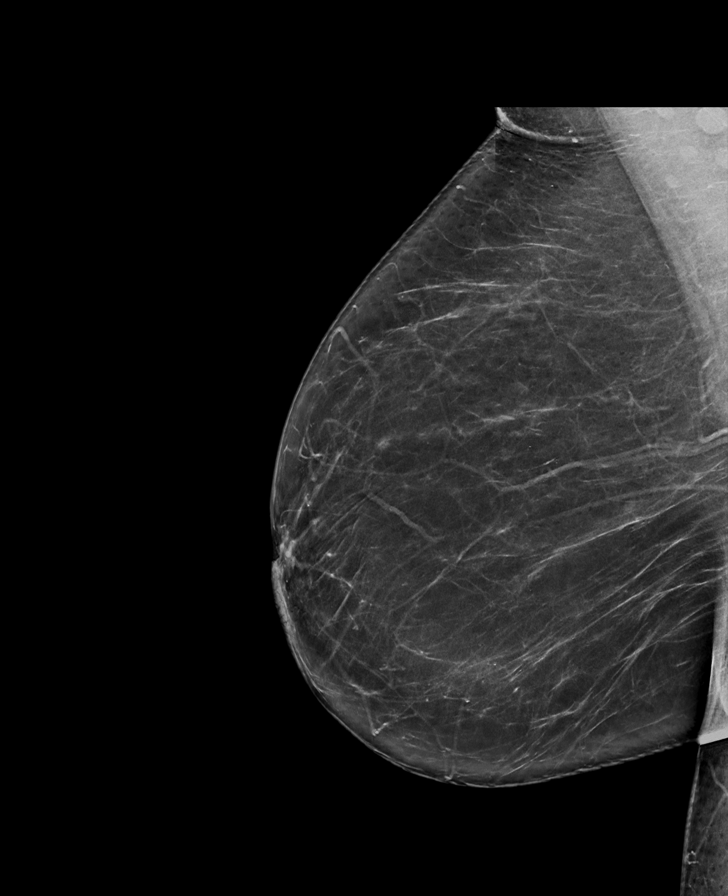

[R CC synth-2D]
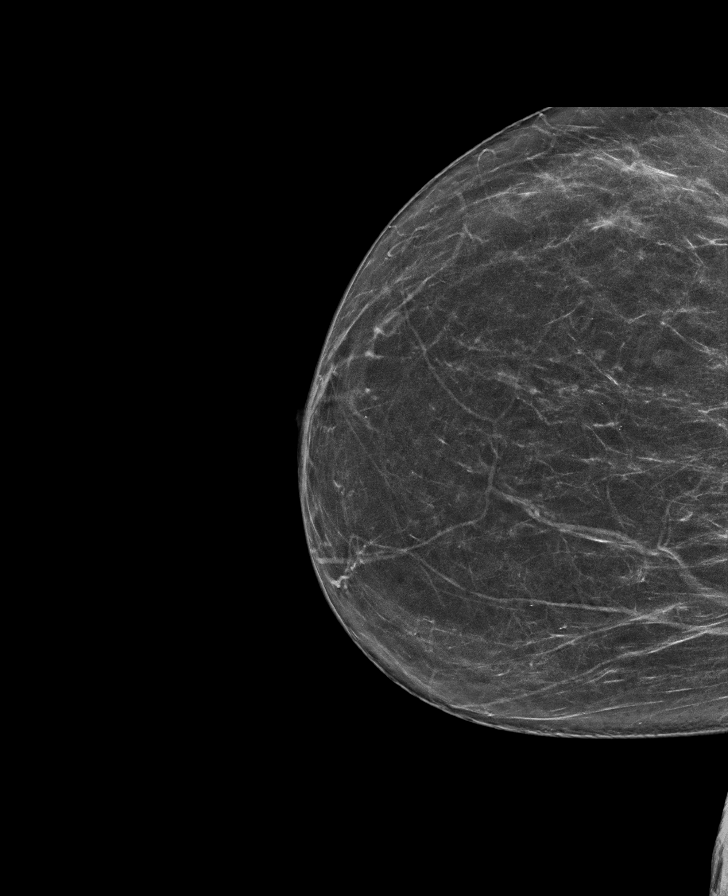

[L CC tomo · tomo slice 33/65.0]
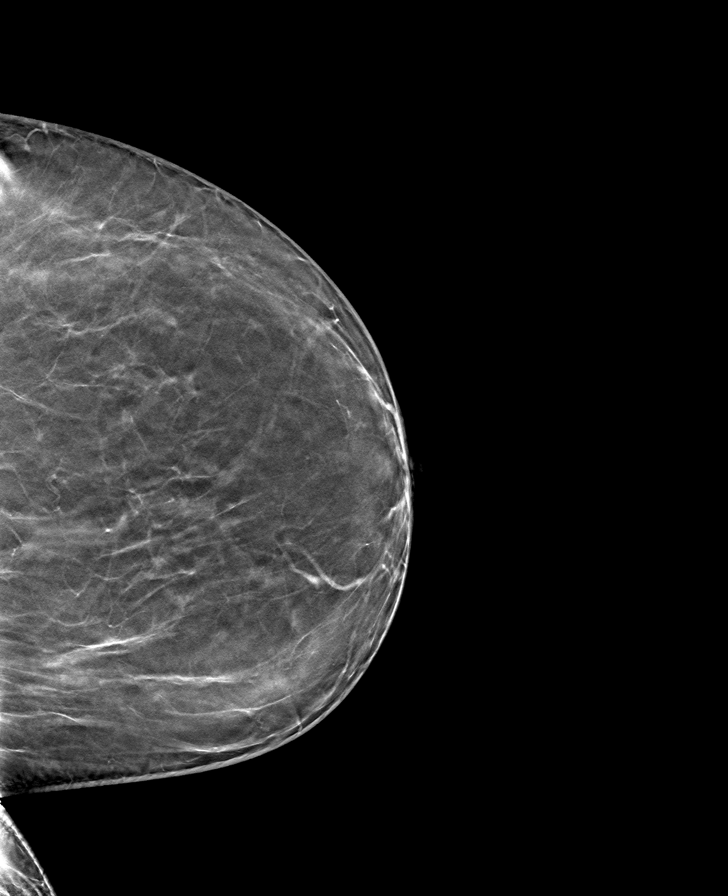

[R CC tomo · tomo slice 33/66.0]
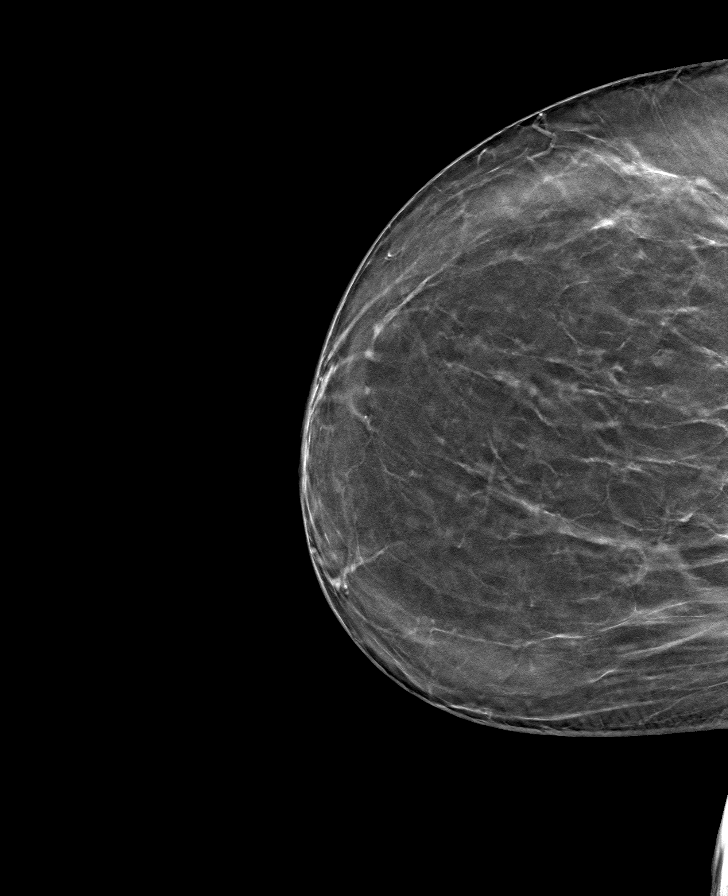

[R MLO tomo · tomo slice 40/79.0]
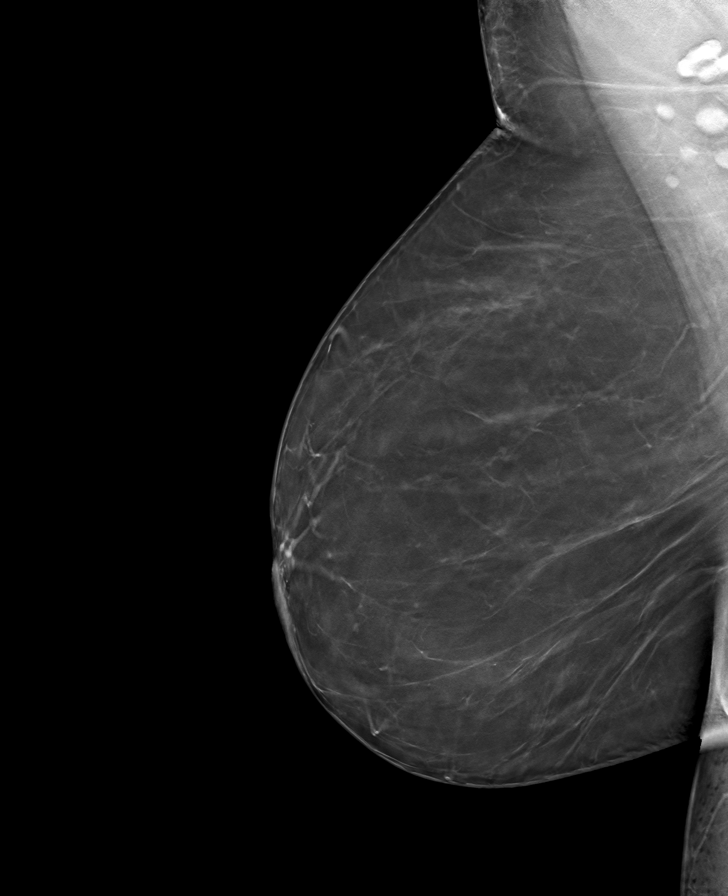

[L MLO tomo · tomo slice 43/84.0]
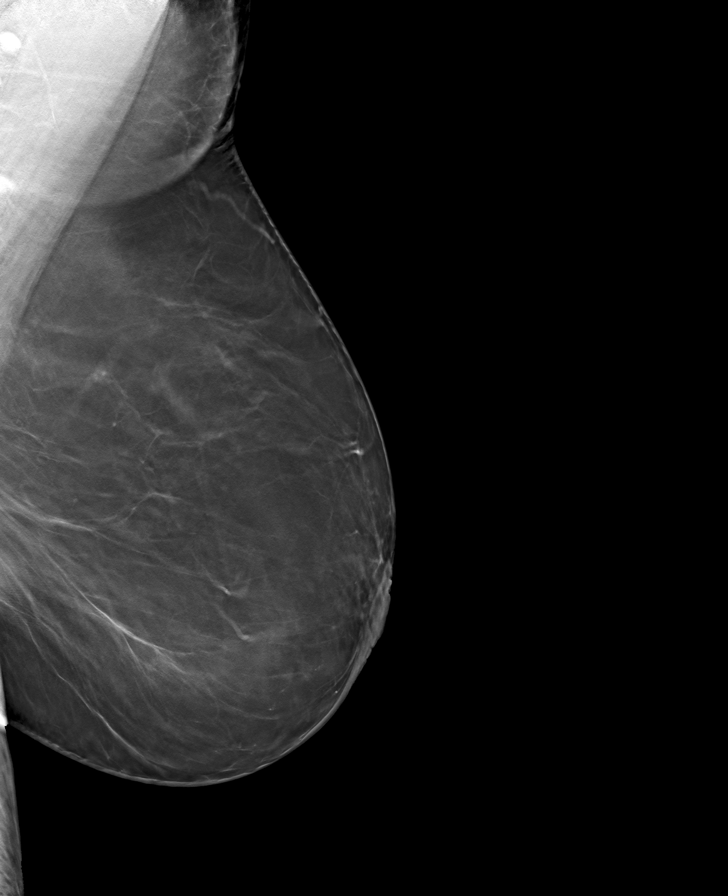

[8 of 24 positions shown; findings below may reference images not displayed]

ACR Breast Density Category b: There are scattered areas of
fibroglandular density.
FINDINGS: There are no findings suspicious for malignancy. Images were
processed with CAD.
IMPRESSION: No mammographic evidence of malignancy. A result letter of this
screening mammogram will be mailed directly to the patient.

RECOMMENDATION:
Screening mammogram in one year. (Code:CN-U-775)

BI-RADS CATEGORY  1: Negative.
